# Patient Record
Sex: Male | Born: 1960 | Race: White | Hispanic: No | Marital: Single | Smoking: Former smoker
Health system: Southern US, Community
[De-identification: ages and names within clinical notes are randomized; demographics above are authoritative.]

## PROBLEM LIST (undated history)

## (undated) DIAGNOSIS — E7800 Pure hypercholesterolemia, unspecified: Secondary | ICD-10-CM

## (undated) DIAGNOSIS — T8859XA Other complications of anesthesia, initial encounter: Secondary | ICD-10-CM

## (undated) DIAGNOSIS — R42 Dizziness and giddiness: Principal | ICD-10-CM

## (undated) DIAGNOSIS — E0590 Thyrotoxicosis, unspecified without thyrotoxic crisis or storm: Secondary | ICD-10-CM

## (undated) DIAGNOSIS — R112 Nausea with vomiting, unspecified: Secondary | ICD-10-CM

## (undated) DIAGNOSIS — K635 Polyp of colon: Secondary | ICD-10-CM

## (undated) DIAGNOSIS — I10 Essential (primary) hypertension: Secondary | ICD-10-CM

## (undated) DIAGNOSIS — C801 Malignant (primary) neoplasm, unspecified: Secondary | ICD-10-CM

## (undated) DIAGNOSIS — M1990 Unspecified osteoarthritis, unspecified site: Secondary | ICD-10-CM

## (undated) DIAGNOSIS — K219 Gastro-esophageal reflux disease without esophagitis: Secondary | ICD-10-CM

## (undated) DIAGNOSIS — Z8489 Family history of other specified conditions: Secondary | ICD-10-CM

## (undated) DIAGNOSIS — G40209 Localization-related (focal) (partial) symptomatic epilepsy and epileptic syndromes with complex partial seizures, not intractable, without status epilepticus: Secondary | ICD-10-CM

## (undated) DIAGNOSIS — D1800 Hemangioma unspecified site: Secondary | ICD-10-CM

## (undated) DIAGNOSIS — Z9889 Other specified postprocedural states: Secondary | ICD-10-CM

## (undated) HISTORY — DX: Polyp of colon: K63.5

## (undated) HISTORY — DX: Pure hypercholesterolemia, unspecified: E78.00

## (undated) HISTORY — DX: Localization-related (focal) (partial) symptomatic epilepsy and epileptic syndromes with complex partial seizures, not intractable, without status epilepticus: G40.209

## (undated) HISTORY — PX: MENISCUS REPAIR: SHX5179

## (undated) HISTORY — DX: Essential (primary) hypertension: I10

## (undated) HISTORY — PX: COLONOSCOPY: SHX174

## (undated) HISTORY — DX: Dizziness and giddiness: R42

## (undated) HISTORY — DX: Hemangioma unspecified site: D18.00

## (undated) HISTORY — PX: WISDOM TOOTH EXTRACTION: SHX21

---

## 2006-09-07 ENCOUNTER — Emergency Department (HOSPITAL_COMMUNITY): Admission: EM | Admit: 2006-09-07 | Discharge: 2006-09-07 | Payer: Self-pay | Admitting: Emergency Medicine

## 2006-12-22 ENCOUNTER — Encounter: Payer: Self-pay | Admitting: Internal Medicine

## 2006-12-23 ENCOUNTER — Inpatient Hospital Stay (HOSPITAL_COMMUNITY): Admission: AD | Admit: 2006-12-23 | Discharge: 2006-12-24 | Payer: Self-pay | Admitting: Internal Medicine

## 2007-03-16 ENCOUNTER — Encounter: Admission: RE | Admit: 2007-03-16 | Discharge: 2007-03-16 | Payer: Self-pay | Admitting: Neurology

## 2008-08-01 ENCOUNTER — Encounter: Admission: RE | Admit: 2008-08-01 | Discharge: 2008-08-01 | Payer: Self-pay | Admitting: Neurology

## 2008-08-01 DIAGNOSIS — D1800 Hemangioma unspecified site: Secondary | ICD-10-CM

## 2008-08-01 HISTORY — DX: Hemangioma unspecified site: D18.00

## 2009-07-01 ENCOUNTER — Emergency Department (HOSPITAL_COMMUNITY): Admission: EM | Admit: 2009-07-01 | Discharge: 2009-07-01 | Payer: Self-pay | Admitting: Emergency Medicine

## 2010-05-14 IMAGING — CR DG RIBS 2V*R*
2 series · 2 of 2 positions shown · non-contrast
Comparison: Chest x-ray of 12/22/2006

CLINICAL DATA: Fell in bathtub with pain and right posterior lower
ribs

RIGHT RIBS - 2 VIEW

[w ribs ap/pa upper right *]
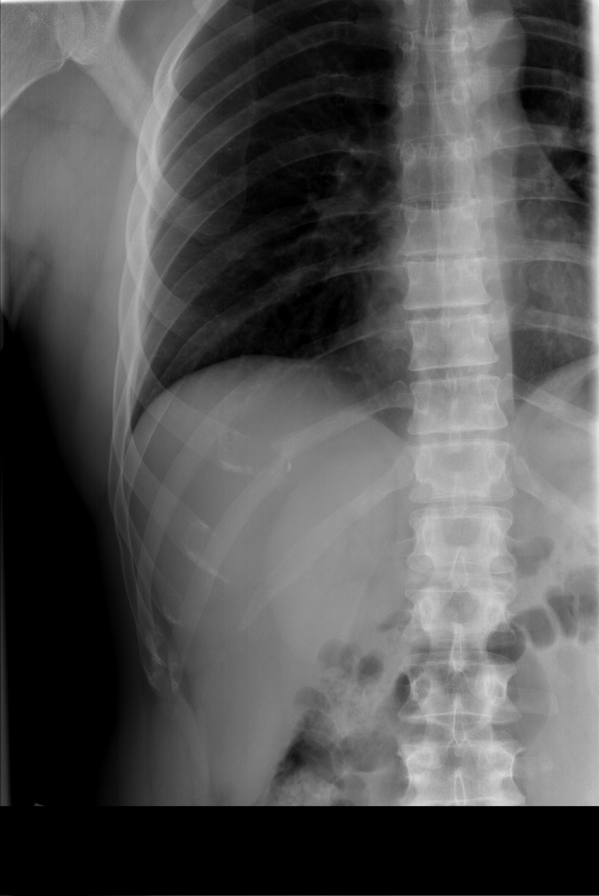

[w ribs ap/pa lower right *]
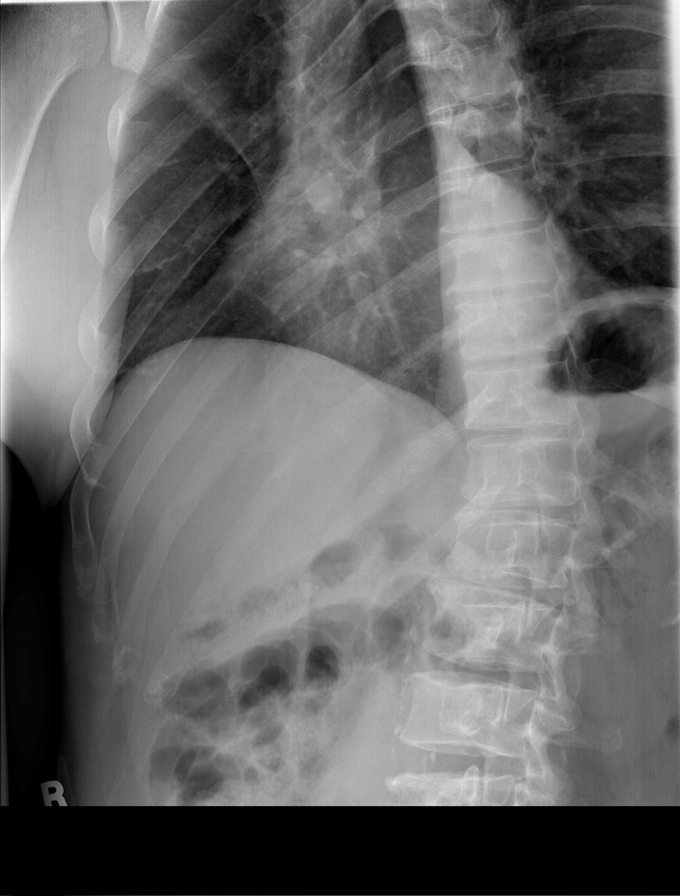

[2 of 2 positions shown; findings below may reference images not displayed]

FINDINGS: There is fracture of the posterior right 11th rib which
may well be acute.  No other acute rib fracture is seen.  The right
lung appears grossly clear.
IMPRESSION: Fracture of the right posterior 11th rib pain which may be acute.

## 2010-06-03 LAB — URINALYSIS, ROUTINE W REFLEX MICROSCOPIC
Bilirubin Urine: NEGATIVE
Glucose, UA: NEGATIVE mg/dL
Hgb urine dipstick: NEGATIVE
Ketones, ur: NEGATIVE mg/dL
Nitrite: NEGATIVE
Protein, ur: NEGATIVE mg/dL
Specific Gravity, Urine: 1.024 (ref 1.005–1.030)
Urobilinogen, UA: 0.2 mg/dL (ref 0.0–1.0)
pH: 6 (ref 5.0–8.0)

## 2010-07-29 NOTE — H&P (Signed)
NAMEMarland Kitchen  BALTAZAR, PEKALA NO.:  [id number]   MEDICAL RECORD NO.:  [id number]          PATIENT TYPE:  OBV   LOCATION:  1321                         FACILITY:  [facility]   PHYSICIAN:  Michelene Gardener, MD    DATE OF BIRTH:  [birth year]   DATE OF ADMISSION:  12/22/2006  DATE OF DISCHARGE:                              HISTORY & PHYSICAL   PRIMARY PHYSICIAN:  [facility].   CHIEF COMPLAINT:  Dizziness, blurred vision and increasing vomiting that  was started today.   HISTORY OF PRESENT ILLNESS:  This is a 50 year old Caucasian male with  no significant past medical history, who presented with the above-  mentioned complaint.  He stated that he was taking his regular diet  every day and he added some nutritional formula because he was working  in his body where he started new vitamins and new meals to help him with  his diet, and that was started yesterday.  Today he slept fine, woke up  around 4:30 to use the bathroom.  Then around 6 o'clock he started  feeling dizzy and then he became cold and sweaty.  He developed  dizziness and unsteady gait and he had blurred vision.  Went to his job  and his symptoms continued and at that time he became more nauseous and  he vomited twice.  Came into the ER, where a CT scan of his head was  done and came to be normal.  When he stood up, he was even more dizzy.  He denied weakness, denied numbness.  There is no tingling.  There is no  incontinence.  He had blurry vision on and off.  There is no slurred  speech, no chest pain and no shortness of breath.Marland Kitchen   PAST MEDICAL HISTORY:  Denied.   PAST SURGICAL HISTORY:  Denied.   ALLERGIES:  No known drug allergies.   MEDICATIONS:  Denied.   SOCIAL HISTORY:  He used to smoke one pack per day for more than 20  years and he quit around 1 year ago.  He drinks 4-5 beers a day and has  been drinking for more than 15 years.  He denied illicit drug use.   FAMILY HISTORY:   Significant for coronary artery disease in his father's  side, and he died at an old age.   REVIEW OF SYSTEMS:  CONSTITUTIONAL:  Positive for fatigability and  dizziness.  EYES:  Positive for blurred vision.  ENT:  He had mild  tinnitus.  There are no hearing abnormalities.  HEART:  No chest pain,  no shortness of breath.  RESPIRATORY:  No cough, no wheezes.  GI:  Positive for nausea, vomiting and mild abdominal discomfort.  There is  no diarrhea and there is no constipation.  GU:  No dysuria and no  hematuria.  ENDOCRINE:  No polyuria, no nocturia.  HEMATOLOGY:  No  bruises, no bleeding.  ID:  No rash, no lesions.  NEUROLOGIC:  No  numbness, no tingling, no weakness, and is positive for dizziness.   PHYSICAL EXAMINATION:  VITAL SIGNS:  Temperature is 96.5, blood pressure  initially was 159/103, pulse 68, respiratory rate is 24.  GENERAL APPEARANCE:  This is a middle-aged Caucasian male not in acute  distress.  HEENT: Conjunctivae are pink.  Pupils are equal, reactive to light and  accommodation.  There is no ptosis.  Hearing is intact.  There is no ear  discharge or infection.  There is no nose infection or bleeding.  Oral  mucosa is dry.  There is no pharyngeal erythema.  NECK:  Supple.  No JVD, no carotid bruit, no thyroid enlargement or  thyroid tenderness.  CARDIOVASCULAR:  S1-S2 are regular.  There are no murmurs or gallops and  no thrills.  RESPIRATORY:  The patient is breathing between 16-18.  There is no use  of accessory muscles.  There are no intercostal retractions.  No  dullness, no other rhonchi and no wheezes.  ABDOMEN:  Soft, not distended, no tenderness or hepatosplenomegaly.  Bowel sounds are normal.  Umbilicus central.  LOWER EXTREMITIES:  No edema, no rash, and no varicose veins.  SKIN:  No rash and no erythema.  NEUROLOGIC:  Cranial nerves are intact I-XII.  There is no motor or  sensory deficit.   LAB RESULTS:  WBC 9.4, hemoglobin 16.0, hematocrit 45.6, MCV  91.8,  platelet count is 387.  Sodium 139, potassium 4.1, chloride 101, bicarb  25, glucose 122, BUN 12, creatinine 1.23, calcium 10.1, AST 37, ALT 24.  Alcohol level less than 5.  Urine toxicology is negative.  CT scan of  the head is negative.  Amylase and lipase within normal.   IMPRESSION AND ASSESSMENT:  1. Dizziness.  Etiology is unclear.  The patient has some neurologic      symptoms including dizziness and blurry vision and he is still      complaining of some blurry vision and gait imbalance.  He had CT      scan of the head that came to be normal.  I will get MRI of his      brain.  2. Nausea and vomiting.  We will put him on antiemetics as needed.  I      will give him IV fluids.  3. Alcohol abuse.  His current level is less than 5.  We will watch      him for any evidence of withdrawal symptoms and if so, then we will      start him on Ativan protocol.  4. Hypertension.  This is diet-controlled.  I will just watch him for      now.   TOTAL ASSESSMENT TIME:  1 hour.      Michelene Gardener, MD  Electronically Signed     NAE/MEDQ  D:  12/22/2006  T:  12/23/2006  Job:  [id number]

## 2010-08-01 NOTE — Discharge Summary (Signed)
NAMEMarland Small  GRADY, MOHABIR NO.:  [id number]   MEDICAL RECORD NO.:  [id number]          PATIENT TYPE:  OBV   LOCATION:  1321                         FACILITY:  [facility]   PHYSICIAN:  Michelene Gardener, MD    DATE OF BIRTH:  10-Mar-1961   DATE OF ADMISSION:  12/22/2006  DATE OF DISCHARGE:  12/23/2006                               DISCHARGE SUMMARY   PRIMARY PHYSICIAN:  The patient used to follow with [facility]. Jonathan Small   DISCHARGE DIAGNOSES:  1. 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  2. Dizziness.  3. Nausea and vomiting.  4. Alcohol abuse.  5. Hypertension   DISCHARGE MEDICATIONS:  None.   CONSULTATIONS:  1. Neurosurgical consult.  2. Neurology consult.   RADIOLOGY STUDIES:  1. Abdominal x-ray showed nonspecific bowel dilatation  2. Chest X-Ray: Showed no acute findings.  3. CT scan of the head on October 8 showed no evidence of acute      abnormality.  4. MRI of brain October 8 showed well-defined mass lesion in the      medial left temporal lobe which most likely represent cyst with      associated hemorrhage.  5. MRA of the brain on October 8 showed area of hemorrhage in the left      temporal lobe.  6. Carotid angiogram on October 10  showed no evidence of intracranial      aneurysm or mass and showed mild narrowing at the origin of the      right vertebral artery.  7. Angiogram of the vertebral arteries again showed no evidence of      aneurysm or mass.   COURSE OF HOSPITALIZATION:  This is a 50 year old male with no  significant past medical history, presented to the hospital on October 8  complaining of dizziness, blurred vision and increasing vomiting that  has been on for 1 day.  In the ER he had CT scan of his head that came  to be normal.  He had MRI of his brain showed possible mass with  possible hemorrhage surrounding the mass.  This case was discussed with  neurosurgery who recommended to transfer the patient from  [facility] to [facility] to get an angiogram for further  evaluation.  Angiogram was done at [facility] and showed no evidence of  mass and there is no evidence of hemorrhage and no evidence of aneurysm.  Neurosurgery cleared the patient to go home.  I recommended him to  follow with his primary physician.  Neurosurgery also recommended the  patient to be seen by neurology.  Neurological consultation was called  and they felt that the patient is stable enough and they will only  follow the patient as an outpatient.  Appointment was given with neurology and the patient was recommended to  follow with his primary physician.  No medications are required at this  time and no more intervention or diagnostic studies are recommended at  this point.   Assessment time is 40 minutes.      Nadear A  Arthor Captain, MD  Electronically Signed     NAE/MEDQ  D:  01/03/2007  T:  01/03/2007  Job:  [id number]

## 2010-12-25 LAB — COMPREHENSIVE METABOLIC PANEL
ALT: 24
AST: 37
Albumin: 4.7
Alkaline Phosphatase: 75
BUN: 12
CO2: 25
Calcium: 10.1
Chloride: 101
Creatinine, Ser: 1.28
GFR calc Af Amer: >60
GFR calc non Af Amer: >60
Glucose, Bld: 122 — ABNORMAL HIGH
Potassium: 4.1
Sodium: 139
Total Bilirubin: 1.2
Total Protein: 7.6

## 2010-12-25 LAB — URINALYSIS, ROUTINE W REFLEX MICROSCOPIC
Bilirubin Urine: NEGATIVE
Glucose, UA: NEGATIVE
Hgb urine dipstick: NEGATIVE
Ketones, ur: 15 — AB
Nitrite: NEGATIVE
Protein, ur: NEGATIVE
Specific Gravity, Urine: 1.023
Urobilinogen, UA: 0.2
pH: 7

## 2010-12-25 LAB — CBC
HCT: 45.6
Hemoglobin: 16
MCHC: 35
MCV: 91.8
Platelets: 387
RBC: 4.97
RDW: 12.5
WBC: 9.4

## 2010-12-25 LAB — DIFFERENTIAL
Basophils Absolute: 0
Basophils Relative: 0
Eosinophils Absolute: 0
Eosinophils Relative: 0
Lymphocytes Relative: 11 — ABNORMAL LOW
Lymphs Abs: 1
Monocytes Absolute: 0.6
Monocytes Relative: 7
Neutro Abs: 7.8 — ABNORMAL HIGH
Neutrophils Relative %: 83 — ABNORMAL HIGH

## 2010-12-25 LAB — RAPID URINE DRUG SCREEN, HOSP PERFORMED
Amphetamines: NOT DETECTED
Barbiturates: NOT DETECTED
Benzodiazepines: NOT DETECTED
Cocaine: NOT DETECTED
Opiates: NOT DETECTED
Tetrahydrocannabinol: NOT DETECTED

## 2010-12-25 LAB — APTT
aPTT: 27
aPTT: 29

## 2010-12-25 LAB — PROTIME-INR
INR: 1
INR: 1
Prothrombin Time: 13.2
Prothrombin Time: 13.8

## 2010-12-25 LAB — LIPASE, BLOOD: Lipase: 32

## 2010-12-25 LAB — TRICYCLICS SCREEN, URINE: TCA Scrn: NOT DETECTED

## 2010-12-25 LAB — ETHANOL: Alcohol, Ethyl (B): <5

## 2010-12-25 LAB — AMYLASE: Amylase: 72

## 2010-12-31 LAB — POCT CARDIAC MARKERS
CKMB, poc: 3.3
Myoglobin, poc: 61
Operator id: 4661
Troponin i, poc: <0.05

## 2010-12-31 LAB — BASIC METABOLIC PANEL
BUN: 14
CO2: 29
Calcium: 9.8
Chloride: 102
Creatinine, Ser: 1.28
GFR calc Af Amer: >60
GFR calc non Af Amer: >60
Glucose, Bld: 115 — ABNORMAL HIGH
Potassium: 4.4
Sodium: 139

## 2010-12-31 LAB — CBC
HCT: 43.6
Hemoglobin: 15.1
MCHC: 34.6
MCV: 91.7
Platelets: 392
RBC: 4.76
RDW: 12.9
WBC: 7.7

## 2010-12-31 LAB — DIFFERENTIAL
Basophils Absolute: 0
Basophils Relative: 0
Eosinophils Absolute: 0.1
Eosinophils Relative: 2
Lymphocytes Relative: 20
Lymphs Abs: 1.5
Monocytes Absolute: 0.7
Monocytes Relative: 9
Neutro Abs: 5.3
Neutrophils Relative %: 69

## 2013-06-07 ENCOUNTER — Encounter: Payer: Self-pay | Admitting: Neurology

## 2013-06-08 ENCOUNTER — Encounter (INDEPENDENT_AMBULATORY_CARE_PROVIDER_SITE_OTHER): Payer: Self-pay

## 2013-06-08 ENCOUNTER — Encounter: Payer: Self-pay | Admitting: Neurology

## 2013-06-08 ENCOUNTER — Ambulatory Visit (INDEPENDENT_AMBULATORY_CARE_PROVIDER_SITE_OTHER): Payer: Managed Care, Other (non HMO) | Admitting: Neurology

## 2013-06-08 VITALS — BP 141/89 | HR 77 | Ht 69.0 in | Wt 180.0 lb

## 2013-06-08 DIAGNOSIS — G40209 Localization-related (focal) (partial) symptomatic epilepsy and epileptic syndromes with complex partial seizures, not intractable, without status epilepticus: Secondary | ICD-10-CM

## 2013-06-08 DIAGNOSIS — R42 Dizziness and giddiness: Secondary | ICD-10-CM

## 2013-06-08 HISTORY — DX: Localization-related (focal) (partial) symptomatic epilepsy and epileptic syndromes with complex partial seizures, not intractable, without status epilepticus: G40.209

## 2013-06-08 HISTORY — DX: Dizziness and giddiness: R42

## 2013-06-08 NOTE — Progress Notes (Signed)
Reason for visit: Dizziness  Jonathan Small is a 53 y.o. male  History of present illness:  Jonathan Small is a 53 year old right-handed white male with a history of episodes of dizziness that are preceded by an unusual odor. The patient was seen through this office in 2010 by Dr. Doy Mince. 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 The patient was having seizure-type events only on occasion. The patient indicates that he has gone almost 2 years without any events, and he has had several episodes over the last 3 months. The patient indicates that he is working more than usual, and he is getting fatigued. The patient has not had any alteration in consciousness with the seizure events, and he denies any problems with jerking. The patient will have to stop what he is doing, and sit still for several minutes until the episode passes. The patient denies any headache before, during, or after the episode. The patient denies any falls with the dizzy episodes. The patient has no numbness or weakness on the extremities or problems with slurred speech. The patient is sent to this office for an evaluation.  Past Medical History  Diagnosis Date  . Hypertension   . Hypercholesteremia   . 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 Polyp of colon   . Dizziness and giddiness 06/08/2013  . Localization-related (focal) (partial) epilepsy and epileptic syndromes with complex partial seizures, without mention of intractable epilepsy 06/08/2013    Past Surgical History  Procedure Laterality Date  . Colonoscopy      Family History  Problem Relation Age of Onset  . Hypercholesterolemia Mother   . Hypertension Mother   . Hypertension Father   . Hypercholesterolemia Father   . Stroke Father   . Diabetes Father   . Cancer Maternal Grandmother   . Cancer Maternal Grandfather   . Seizures Neg Hx     Social  history:  reports that he has quit smoking. He has never used smokeless tobacco. He reports that he drinks alcohol. He reports that he does not use illicit drugs.  Medications:  Current Outpatient Prescriptions on File Prior to Visit  Medication Sig Dispense Refill  . aspirin 81 MG tablet Take 81 mg by mouth daily.      . irbesartan-hydrochlorothiazide (AVALIDE) 300-12.5 MG per tablet Take 1 tablet by mouth daily.      . sildenafil (VIAGRA) 100 MG tablet Take 100 mg by mouth daily as needed for erectile dysfunction.      . simvastatin (ZOCOR) 40 MG tablet Take 40 mg by mouth daily.      . valACYclovir (VALTREX) 1000 MG tablet Take 1,000 mg by mouth 2 (two) times daily as needed.       No current facility-administered medications on file prior to visit.      Allergies  Allergen Reactions  . Benicar [Olmesartan] Rash    ROS:  Out of a complete 14 system review of symptoms, the patient complains only of the following symptoms, and all other reviewed systems are negative.  Dizziness  Blood pressure 141/89, pulse 77, height 5\' 9"  (1.753 m), weight 180 lb (81.647 kg).  Physical Exam  General: The patient is alert and cooperative at the time of the examination.  Eyes: Pupils are equal, round, and reactive to light. Discs are flat bilaterally.  Neck: The neck is supple, no  carotid bruits are noted.  Respiratory: The respiratory examination is clear.  Cardiovascular: The cardiovascular examination reveals a regular rate and rhythm, no obvious murmurs or rubs are noted.  Skin: Extremities are without significant edema.  Neurologic Exam  Mental status: The patient is alert and oriented x 3 at the time of the examination. The patient has apparent normal recent and remote memory, with an apparently normal attention span and concentration ability.  Cranial nerves: Facial symmetry is present. There is good sensation of the face to pinprick and soft touch bilaterally. The strength of  the facial muscles and the muscles to head turning and shoulder shrug are normal bilaterally. Speech is well enunciated, no aphasia or dysarthria is noted. Extraocular movements are full. Visual fields are full. The tongue is midline, and the patient has symmetric elevation of the soft palate. No obvious hearing deficits are noted.  Motor: The motor testing reveals 5 over 5 strength of all 4 extremities. Good symmetric motor tone is noted throughout.  Sensory: Sensory testing is intact to pinprick, soft touch, vibration sensation, and position sense on all 4 extremities. No evidence of extinction is noted.  Coordination: Cerebellar testing reveals good finger-nose-finger and heel-to-shin bilaterally.  Gait and station: Gait is normal. Tandem gait is normal. Romberg is negative. No drift is seen.  Reflexes: Deep tendon reflexes are symmetric and normal bilaterally. Toes are downgoing bilaterally.   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 There has been  involution since the prior studies of 2008.    Assessment/Plan:  1. Simple partial seizures  2. Left mesial temporal cavernous angioma  The patient likely is having episodes of seizures, but the seizures do not generalize, and the patient does not have alteration of consciousness. So far, the seizures have not been extremely frequent, but if this changes over time, the patient may require anticonvulsant medications. The patient will be set up for MRI evaluation of the brain to reevaluate the left temporal area to exclude a microhemorrhage. The patient will followup through this office if needed. The patient is to contact me if the seizure frequency dramatically increases.  Jill Alexanders MD 06/08/2013  7:39 PM  [facility] [address] [place], [place] [address]  Phone [phone] Fax [phone]

## 2013-06-08 NOTE — Patient Instructions (Signed)
Epilepsy Epilepsy is a disorder in which a person has repeated seizures over time. A seizure is a release of abnormal electrical activity in the brain. Seizures can cause a change in attention, behavior, or the ability to remain awake and alert (altered mental status). Seizures often involve uncontrollable shaking (convulsions).  Most people with epilepsy lead normal lives. However, people with epilepsy are at an increased risk of falls, accidents, and injuries. Therefore, it is important to begin treatment right away. CAUSES  Epilepsy has many possible causes. Anything that disturbs the normal pattern of brain cell activity can lead to seizures. This may include:   Head injury.  Birth trauma.  High fever as a child.  Stroke.  Bleeding into or around the brain.  Certain drugs.  Prolonged low oxygen, such as what occurs after CPR efforts.  Abnormal brain development.  Certain illnesses, such as meningitis, encephalitis (brain infection), malaria, and other infections.  An imbalance of nerve signaling chemicals (neurotransmitters).  SIGNS AND SYMPTOMS  The symptoms of a seizure can vary greatly from one person to another. Right before a seizure, you may have a warning (aura) that a seizure is about to occur. An aura may include the following symptoms:  Fear or anxiety.  Nausea.  Feeling like the room is spinning (vertigo).  Vision changes, such as seeing flashing lights or spots. Common symptoms during a seizure include:  Abnormal sensations, such as an abnormal smell or a bitter taste in the mouth.   Sudden, general body stiffness.   Convulsions that involve rhythmic jerking of the face, arm, or leg on one or both sides.   Sudden change in consciousness.   Appearing to be awake but not responding.   Appearing to be asleep but cannot be awakened.   Grimacing, chewing, lip smacking, drooling, tongue biting, or loss of bowel or bladder control. After a seizure,  you may feel sleepy for a while. DIAGNOSIS  Your health care provider will ask about your symptoms and take a medical history. Descriptions from any witnesses to your seizures will be very helpful in the diagnosis. A physical exam, including a detailed neurological exam, is necessary. Various tests may be done, such as:   An electroencephalogram (EEG). This is a painless test of your brain waves. In this test, a diagram is created of your brain waves. These diagrams can be interpreted by a specialist.  An MRI of the brain.   A CT scan of the brain.   A spinal tap (lumbar puncture, LP).  Blood tests to check for signs of infection or abnormal blood chemistry. TREATMENT  There is no cure for epilepsy, but it is generally treatable. Once epilepsy is diagnosed, it is important to begin treatment as soon as possible. For most people with epilepsy, seizures can be controlled with medicines. The following may also be used:  A pacemaker for the brain (vagus nerve stimulator) can be used for people with seizures that are not well controlled by medicine.  Surgery on the brain. For some people, epilepsy eventually goes away. HOME CARE INSTRUCTIONS   Follow your health care provider's recommendations on driving and safety in normal activities.  Get enough rest. Lack of sleep can cause seizures.  Only take over-the-counter or prescription medicines as directed by your health care provider. Take any prescribed medicine exactly as directed.  Avoid any known triggers of your seizures.  Keep a seizure diary. Record what you recall about any seizure, especially any possible trigger.   Make   sure the people you live and work with know that you are prone to seizures. They should receive instructions on how to help you. In general, a witness to a seizure should:   Cushion your head and body.   Turn you on your side.   Avoid unnecessarily restraining you.   Not place anything inside your  mouth.   Call for emergency medical help if there is any question about what has occurred.   Follow up with your health care provider as directed. You may need regular blood tests to monitor the levels of your medicine.  SEEK MEDICAL CARE IF:   You develop signs of infection or other illness. This might increase the risk of a seizure.   You seem to be having more frequent seizures.   Your seizure pattern is changing.  SEEK IMMEDIATE MEDICAL CARE IF:   You have a seizure that does not stop after a few moments.   You have a seizure that causes any difficulty in breathing.   You have a seizure that results in a very severe headache.   You have a seizure that leaves you with the inability to speak or use a part of your body.  Document Released: 03/02/2005 Document Revised: 12/21/2012 Document Reviewed: 10/12/2012 [employer] Patient Information 2014 [employer].  

## 2017-12-10 DIAGNOSIS — Z23 Encounter for immunization: Secondary | ICD-10-CM | POA: Diagnosis not present

## 2017-12-10 DIAGNOSIS — E782 Mixed hyperlipidemia: Secondary | ICD-10-CM | POA: Diagnosis not present

## 2017-12-10 DIAGNOSIS — I10 Essential (primary) hypertension: Secondary | ICD-10-CM | POA: Diagnosis not present

## 2017-12-10 DIAGNOSIS — Z566 Other physical and mental strain related to work: Secondary | ICD-10-CM | POA: Diagnosis not present

## 2018-04-07 DIAGNOSIS — E782 Mixed hyperlipidemia: Secondary | ICD-10-CM | POA: Diagnosis not present

## 2018-04-07 DIAGNOSIS — E0590 Thyrotoxicosis, unspecified without thyrotoxic crisis or storm: Secondary | ICD-10-CM | POA: Diagnosis not present

## 2018-04-07 DIAGNOSIS — I10 Essential (primary) hypertension: Secondary | ICD-10-CM | POA: Diagnosis not present

## 2018-04-07 DIAGNOSIS — Z125 Encounter for screening for malignant neoplasm of prostate: Secondary | ICD-10-CM | POA: Diagnosis not present

## 2018-06-10 DIAGNOSIS — E0590 Thyrotoxicosis, unspecified without thyrotoxic crisis or storm: Secondary | ICD-10-CM | POA: Diagnosis not present

## 2018-06-10 DIAGNOSIS — H5789 Other specified disorders of eye and adnexa: Secondary | ICD-10-CM | POA: Diagnosis not present

## 2018-07-21 DIAGNOSIS — M25561 Pain in right knee: Secondary | ICD-10-CM | POA: Diagnosis not present

## 2018-09-01 DIAGNOSIS — S60221A Contusion of right hand, initial encounter: Secondary | ICD-10-CM | POA: Diagnosis not present

## 2018-09-01 DIAGNOSIS — S83241D Other tear of medial meniscus, current injury, right knee, subsequent encounter: Secondary | ICD-10-CM | POA: Diagnosis not present

## 2018-09-07 DIAGNOSIS — E0590 Thyrotoxicosis, unspecified without thyrotoxic crisis or storm: Secondary | ICD-10-CM | POA: Diagnosis not present

## 2019-02-13 ENCOUNTER — Other Ambulatory Visit: Payer: Self-pay | Admitting: Cardiology

## 2019-02-13 DIAGNOSIS — Z20822 Contact with and (suspected) exposure to covid-19: Secondary | ICD-10-CM

## 2019-02-15 LAB — NOVEL CORONAVIRUS, NAA: SARS-CoV-2, NAA: NOT DETECTED

## 2019-03-22 ENCOUNTER — Other Ambulatory Visit: Payer: Self-pay | Admitting: Cardiology

## 2019-03-22 DIAGNOSIS — Z20822 Contact with and (suspected) exposure to covid-19: Secondary | ICD-10-CM

## 2019-03-24 LAB — NOVEL CORONAVIRUS, NAA

## 2019-07-04 DIAGNOSIS — R102 Pelvic and perineal pain: Secondary | ICD-10-CM | POA: Diagnosis not present

## 2019-07-04 DIAGNOSIS — R351 Nocturia: Secondary | ICD-10-CM | POA: Diagnosis not present

## 2019-07-20 DIAGNOSIS — S83241D Other tear of medial meniscus, current injury, right knee, subsequent encounter: Secondary | ICD-10-CM | POA: Diagnosis not present

## 2019-07-24 NOTE — Progress Notes (Signed)
07/25/19 3:58 PM   Kerrie Buffalo 1960-05-06 [id number]  Referring provider: Lawerance Cruel, MD [address] [place],  [place] [address] Chief Complaint  Patient presents with  . Nocturia    HPI: Jonathan Small is a 59 y.o. M who presents today for the evaluation and management of nocturia.   Visited PCP on 07/04/19 c/o of nocturia 1-2 to 15x a night and waking up w/ urgency.   Today, he reports of nocturia x 5-6 managed w/ Tamsulosin 0.4 mg started 1 month ago.   1 year ago he was getting up x 2-3.   He eats supper close to his bedtime w/ 3-4 12 oz beers q day 2 hours prior to bed. He also states of drinking 1 gallon of water throughout the day.   He does have leg swelling which he manages w/ ankle braces.   PVR 14 mL.   He his a loud snorer at night and has not undergone a sleep study before.   Denies hx of diabetes and has not seen a urologist before. Denies hx of kidney stones, UTI, dysuria or gross hematuria.   FHx of prostate issues.   PSA 1.32 as of 04/17/19.   IPSS    Row Name 07/25/19 1300         International Prostate Symptom Score   How often have you had the sensation of not emptying your bladder?  About half the time     How often have you had to urinate less than every two hours?  Less than half the time     How often have you found you stopped and started again several times when you urinated?  More than half the time     How often have you found it difficult to postpone urination?  Not at All     How often have you had a weak urinary stream?  More than half the time     How often have you had to strain to start urination?  More than half the time     How many times did you typically get up at night to urinate?  5 Times     Total IPSS Score  22       Quality of Life due to urinary symptoms   If you were to spend the rest of your life with your urinary condition just the way it is now how would you feel about that?  Mostly Disatisfied         Score:  1-7 Mild 8-19 Moderate 20-35 Severe   PMH: Past Medical History:  Diagnosis Date  . 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 Dizziness and giddiness 06/08/2013  . Hypercholesteremia   . Hypertension   . Localization-related (focal) (partial) epilepsy and epileptic syndromes with complex partial seizures, without mention of intractable epilepsy 06/08/2013  . Polyp of colon     Surgical History: Past Surgical History:  Procedure Laterality Date  . COLONOSCOPY      Home Medications:  Allergies as of 07/25/2019      Reactions   Benicar [olmesartan] Rash      Medication List       Accurate as of Jul 25, 2019  3:58 PM. If you have any questions, ask your nurse or doctor.        amitriptyline 10 MG tablet Commonly known as: ELAVIL Take 10 mg by mouth daily.   aspirin 81 MG tablet Take 81 mg by  mouth daily.   irbesartan-hydrochlorothiazide 300-12.5 MG tablet Commonly known as: AVALIDE Take 1 tablet by mouth daily.   methimazole 5 MG tablet Commonly known as: TAPAZOLE   sildenafil 100 MG tablet Commonly known as: VIAGRA Take 100 mg by mouth daily as needed for erectile dysfunction.   simvastatin 40 MG tablet Commonly known as: ZOCOR Take 40 mg by mouth daily.   valACYclovir 1000 MG tablet Commonly known as: VALTREX Take 1,000 mg by mouth 2 (two) times daily as needed.       Allergies:  Allergies  Allergen Reactions  . Benicar [Olmesartan] Rash    Family History: Family History  Problem Relation Age of Onset  . Hypercholesterolemia Mother   . Hypertension Mother   . Hypertension Father   . Hypercholesterolemia Father   . Stroke Father   . Diabetes Father   . Cancer Maternal Grandmother   . Cancer Maternal Grandfather   . Seizures Neg Hx     Social History:  reports that he has quit smoking. He has never used smokeless tobacco. He reports current alcohol use. He reports that he does not use drugs.   Physical Exam: BP (!)  163/93   Pulse 80   Ht 5\' 8"  (1.727 m)   Wt 176 lb (79.8 kg)   BMI 26.76 kg/m   Constitutional:  Alert and oriented, No acute distress. HEENT: [place] AT, moist mucus membranes.  Trachea midline, no masses. Thick neck. Cardiovascular: No clubbing, cyanosis, or edema. Respiratory: Normal respiratory effort, no increased work of breathing. Rectal: 50+ g prostate w/ no nodules or tenderness, normal sphincter tone Skin: No rashes, bruises or suspicious lesions. Neurologic: Grossly intact, no focal deficits, moving all 4 extremities. Psychiatric: Normal mood and affect.  Laboratory Data:  Urinalysis UA negative.   Pertinent Imaging: Results for orders placed or performed in visit on 07/25/19  BLADDER SCAN AMB NON-IMAGING  Result Value Ref Range   Scan Result 14 ML    Assessment & Plan:    1. Nocturia  Adequate emptying Symptoms primary during night thus suspect multifactoria Discussed behavior modifications including not drinking 4 hrs prior to bedtime and limited beer intake - suspect large contributing factor Recommended him to discuss sleep study w/ PCP for referral- pathophysiology of this discussed in detail and history highly suspicious for undiagnosed/ untreated OSA Will consider further intervention if above is ineffective  F/u in 3 months to reaccess   2. BPH with lower urinary tract symptoms Prostate is mildly enlarged today but primary bother #1 Recommend continue flomax and address #1 first If symptoms fail to improve, consider addition of finasteride vs. Surgical/ procedural intervention He understands the above is agreeable with this plan  F/u 3 months for IPSS/ [facility]  [facility] [address] [place],  [address] [phone]  I, Lucas Mallow, am acting as a scribe for Dr. Hollice Espy,  I have reviewed the above documentation for accuracy and completeness, and I agree with the above.   Hollice Espy,  MD

## 2019-07-25 ENCOUNTER — Encounter: Payer: Self-pay | Admitting: Urology

## 2019-07-25 ENCOUNTER — Other Ambulatory Visit: Payer: Self-pay

## 2019-07-25 ENCOUNTER — Ambulatory Visit: Payer: BC Managed Care – PPO | Admitting: Urology

## 2019-07-25 VITALS — BP 163/93 | HR 80 | Ht 68.0 in | Wt 176.0 lb

## 2019-07-25 DIAGNOSIS — R351 Nocturia: Secondary | ICD-10-CM

## 2019-07-25 LAB — BLADDER SCAN AMB NON-IMAGING: Scan Result: 14

## 2019-07-26 LAB — URINALYSIS, COMPLETE
Bilirubin, UA: NEGATIVE
Glucose, UA: NEGATIVE
Ketones, UA: NEGATIVE
Leukocytes,UA: NEGATIVE
Nitrite, UA: NEGATIVE
Protein,UA: NEGATIVE
RBC, UA: NEGATIVE
Specific Gravity, UA: 1.02 (ref 1.005–1.030)
Urobilinogen, Ur: 0.2 mg/dL (ref 0.2–1.0)
pH, UA: 7 (ref 5.0–7.5)

## 2019-07-26 LAB — MICROSCOPIC EXAMINATION
Bacteria, UA: NONE SEEN
Epithelial Cells (non renal): NONE SEEN /hpf (ref 0–10)
RBC, Urine: NONE SEEN /hpf (ref 0–2)

## 2019-08-02 ENCOUNTER — Telehealth: Payer: Self-pay | Admitting: *Deleted

## 2019-08-02 ENCOUNTER — Encounter: Payer: Self-pay | Admitting: *Deleted

## 2019-08-02 NOTE — Telephone Encounter (Addendum)
Mailed letter to PCP  ----- Message from Hollice Espy, MD sent at 07/25/2019  4:02 PM EDT ----- This patient's PCP is not in our system.  I want to make sure he see's my recommendations of sleep study referral.

## 2019-08-08 DIAGNOSIS — H11002 Unspecified pterygium of left eye: Secondary | ICD-10-CM | POA: Diagnosis not present

## 2019-08-30 DIAGNOSIS — H2513 Age-related nuclear cataract, bilateral: Secondary | ICD-10-CM | POA: Diagnosis not present

## 2019-08-30 DIAGNOSIS — H02883 Meibomian gland dysfunction of right eye, unspecified eyelid: Secondary | ICD-10-CM | POA: Diagnosis not present

## 2019-08-30 DIAGNOSIS — H02886 Meibomian gland dysfunction of left eye, unspecified eyelid: Secondary | ICD-10-CM | POA: Diagnosis not present

## 2019-10-05 DIAGNOSIS — H11052 Peripheral pterygium, progressive, left eye: Secondary | ICD-10-CM | POA: Diagnosis not present

## 2019-10-30 NOTE — Progress Notes (Signed)
10/31/2019 12:49 PM   Jonathan Small [birth year] [id number]  Referring provider: Lawerance Cruel, MD [address] [place],  [place] [address] Chief Complaint  Patient presents with  . Nocturia    HPI: Jonathan Small is a 59 y.o. male who presents today for a 3 month follow up of BPH with LUTS and nocturia.   Visited PCP on 07/04/19 c/o of nocturia 1-2 to 15x a night and waking up w/ urgency.   Last visit, he reported of nocturia x 5-6 managed w/ Tamsulosin 0.4 mg started 1 month ago. 1 year ago he was getting up x 2-3. He ate supper close to his bedtime w/ 3-4 12 oz beers q day 2 hours prior to bed. He also stateed of drinking 1 gallon of water throughout the day. He does have leg swelling which he manages w/ ankle braces. PVR was 14 mL.   He was a loud snorer at night and had not undergone a sleep study before.   Denies hx of diabetes and has not seen a urologist before. Denies hx of kidney stones, UTI, dysuria or gross hematuria.   FHx of prostate issues.   PSA 1.32 as of 04/17/19.   Today he reports having to force his urination. When he has flatulence he no longer need to urinate. He feels like symptoms are more during the day time now.   He is no longer taking Flomax secondary to having no symptomatic relief.   He reports being under a lot of work and family related stress. He has not undergone a sleep study.   IPSS    Row Name 10/31/19 0800         International Prostate Symptom Score   How often have you had the sensation of not emptying your bladder? About half the time     How often have you had to urinate less than every two hours? More than half the time     How often have you found you stopped and started again several times when you urinated? More than half the time     How often have you found it difficult to postpone urination? Not at All     How often have you had a weak urinary stream? About half the time     How often have you had to strain to  start urination? More than half the time     How many times did you typically get up at night to urinate? 5 Times     Total IPSS Score 23       Quality of Life due to urinary symptoms   If you were to spend the rest of your life with your urinary condition just the way it is now how would you feel about that? Mostly Disatisfied            Score:  1-7 Mild 8-19 Moderate 20-35 Severe  PMH: Past Medical History:  Diagnosis Date  . 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 Dizziness and giddiness 06/08/2013  . Hypercholesteremia   . Hypertension   . Localization-related (focal) (partial) epilepsy and epileptic syndromes with complex partial seizures, without mention of intractable epilepsy 06/08/2013  . Polyp of colon     Surgical History: Past Surgical History:  Procedure Laterality Date  . COLONOSCOPY      Home Medications:  Allergies as of 10/31/2019      Reactions   Benicar [olmesartan] Rash  Medication List       Accurate as of October 31, 2019 12:49 PM. If you have any questions, ask your nurse or doctor.        amitriptyline 10 MG tablet Commonly known as: ELAVIL Take 10 mg by mouth daily.   aspirin 81 MG tablet Take 81 mg by mouth daily.   irbesartan-hydrochlorothiazide 300-12.5 MG tablet Commonly known as: AVALIDE Take 1 tablet by mouth daily.   methazolamide 50 MG tablet Commonly known as: NEPTAZANE Take by mouth.   methimazole 5 MG tablet Commonly known as: TAPAZOLE   sildenafil 100 MG tablet Commonly known as: VIAGRA Take 100 mg by mouth daily as needed for erectile dysfunction.   simvastatin 40 MG tablet Commonly known as: ZOCOR Take 40 mg by mouth daily.   simvastatin 10 MG tablet Commonly known as: ZOCOR Take by mouth.   telmisartan 20 MG tablet Commonly known as: MICARDIS Take by mouth.   telmisartan-hydrochlorothiazide 80-12.5 MG tablet Commonly known as: MICARDIS HCT   valACYclovir 1000 MG tablet Commonly known as:  VALTREX Take 1,000 mg by mouth 2 (two) times daily as needed.       Allergies:  Allergies  Allergen Reactions  . Benicar [Olmesartan] Rash    Family History: Family History  Problem Relation Age of Onset  . Hypercholesterolemia Mother   . Hypertension Mother   . Hypertension Father   . Hypercholesterolemia Father   . Stroke Father   . Diabetes Father   . Cancer Maternal Grandmother   . Cancer Maternal Grandfather   . Seizures Neg Hx     Social History:  reports that he has quit smoking. He has never used smokeless tobacco. He reports current alcohol use. He reports that he does not use drugs.   Physical Exam: BP (!) 168/99   Pulse 77   Wt 175 lb (79.4 kg)   BMI 26.61 kg/m   Constitutional:  Alert and oriented, No acute distress. HEENT: [place] AT, moist mucus membranes.  Trachea midline, no masses. Cardiovascular: No clubbing, cyanosis, or edema. Respiratory: Normal respiratory effort, no increased work of breathing. Skin: No rashes, bruises or suspicious lesions. Neurologic: Grossly intact, no focal deficits, moving all 4 extremities. Psychiatric: Normal mood and affect.   Assessment & Plan:    1.Nocturia Encouraged patient to discuss sleep study with PCP. Patient adamantly reports that he does not snore however this still could be an underlying factor and often is when symptoms are primarily nocturnal  2. BPH with LUTS  IPSS score: 23, severe. Patient stopped Flomax due to no symptomatic relief.  We discussed addition of finasteride vs surgical/procedural intervention.  Patient agreed to cystoscopy/ TRUS for surgical planning  Schedule cysto / [facility] [address], [place], [place] [address] [phone]  I, Selena Batten, am acting as a scribe for Dr. Hollice Espy.  I have reviewed the above documentation for accuracy and completeness, and I agree with the above.   Hollice Espy, MD

## 2019-10-31 ENCOUNTER — Other Ambulatory Visit: Payer: Self-pay

## 2019-10-31 ENCOUNTER — Ambulatory Visit: Payer: BC Managed Care – PPO | Admitting: Urology

## 2019-10-31 VITALS — BP 168/99 | HR 77 | Wt 175.0 lb

## 2019-10-31 DIAGNOSIS — R351 Nocturia: Secondary | ICD-10-CM | POA: Diagnosis not present

## 2019-10-31 DIAGNOSIS — R3911 Hesitancy of micturition: Secondary | ICD-10-CM

## 2019-10-31 DIAGNOSIS — N401 Enlarged prostate with lower urinary tract symptoms: Secondary | ICD-10-CM | POA: Diagnosis not present

## 2019-10-31 NOTE — Patient Instructions (Signed)
Cystoscopy Cystoscopy is a procedure that is used to help diagnose and sometimes treat conditions that affect the lower urinary tract. The lower urinary tract includes the bladder and the urethra. The urethra is the tube that drains urine from the bladder. Cystoscopy is done using a thin, tube-shaped instrument with a light and camera at the end (cystoscope). The cystoscope may be hard or flexible, depending on the goal of the procedure. The cystoscope is inserted through the urethra, into the bladder. Cystoscopy may be recommended if you have:  Urinary tract infections that keep coming back.  Blood in the urine (hematuria).  An inability to control when you urinate (urinary incontinence) or an overactive bladder.  Unusual cells found in a urine sample.  A blockage in the urethra, such as a urinary stone.  Painful urination.  An abnormality in the bladder found during an intravenous pyelogram (IVP) or CT scan. Cystoscopy may also be done to remove a sample of tissue to be examined under a microscope (biopsy). What are the risks? Generally, this is a safe procedure. However, problems may occur, including:  Infection.  Bleeding.  What happens during the procedure?  1. You will be given one or more of the following: ? A medicine to numb the area (local anesthetic). 2. The area around the opening of your urethra will be cleaned. 3. The cystoscope will be passed through your urethra into your bladder. 4. Germ-free (sterile) fluid will flow through the cystoscope to fill your bladder. The fluid will stretch your bladder so that your health care provider can clearly examine your bladder walls. 5. Your doctor will look at the urethra and bladder. 6. The cystoscope will be removed The procedure may vary among health care providers  What can I expect after the procedure? After the procedure, it is common to have: 1. Some soreness or pain in your abdomen and urethra. 2. Urinary symptoms.  These include: ? Mild pain or burning when you urinate. Pain should stop within a few minutes after you urinate. This may last for up to 1 week. ? A small amount of blood in your urine for several days. ? Feeling like you need to urinate but producing only a small amount of urine. Follow these instructions at home: General instructions  Return to your normal activities as told by your health care provider.   Do not drive for 24 hours if you were given a sedative during your procedure.  Watch for any blood in your urine. If the amount of blood in your urine increases, call your health care provider.  If a tissue sample was removed for testing (biopsy) during your procedure, it is up to you to get your test results. Ask your health care provider, or the department that is doing the test, when your results will be ready.  Drink enough fluid to keep your urine pale yellow.  Keep all follow-up visits as told by your health care provider. This is important. Contact a health care provider if you:  Have pain that gets worse or does not get better with medicine, especially pain when you urinate.  Have trouble urinating.  Have more blood in your urine. Get help right away if you:  Have blood clots in your urine.  Have abdominal pain.  Have a fever or chills.  Are unable to urinate. Summary  Cystoscopy is a procedure that is used to help diagnose and sometimes treat conditions that affect the lower urinary tract.  Cystoscopy is done using   a thin, tube-shaped instrument with a light and camera at the end.  After the procedure, it is common to have some soreness or pain in your abdomen and urethra.  Watch for any blood in your urine. If the amount of blood in your urine increases, call your health care provider.  If you were prescribed an antibiotic medicine, take it as told by your health care provider. Do not stop taking the antibiotic even if you start to feel better. This  information is not intended to replace advice given to you by your health care provider. Make sure you discuss any questions you have with your health care provider. Document Revised: 02/22/2018 Document Reviewed: 02/22/2018 Elsevier Patient Education  2020 [employer] Inc.  Transrectal Ultrasound Transrectal ultrasound is a procedure that uses sound waves to create images of your prostate gland and nearby tissues. The sound waves are sent through the wall of your rectum into your prostate gland, which is located in front of your rectum. The images show the size and shape of your prostate gland and nearby structures. You may have this test if you have:  Trouble urinating.  Infertility.  An abnormal prostate screening exam. Tell a health care provider about:  Any allergies you have.  All medicines you are taking, including vitamins, herbs, eye drops, creams, and over-the-counter medicines.  Any blood disorders you have.  Any medical conditions you have.  Any surgeries you have had. What are the risks? Generally, this is a safe procedure. However, problems may occur, including:  Discomfort during the procedure. This is rare.  Blood in your urine or sperm after the procedure. This is rare. What happens before the procedure?  Your health care provider may instruct you to use an enema 1-4 hours before the procedure. Follow instructions from your health care provider about how to do the enema.  Ask your health care provider about changing or stopping your regular medicines. This is especially important if you are taking diabetes medicines or blood thinners. What happens during the procedure?  You will be asked to lie down on your left side on an examination table.  You will bend your knees toward your chest.  A lubricated probe will be gently inserted into your rectum. This may cause a feeling of fullness.  The probe will send signals to a computer that will create images.  The  technician will slightly rotate the probe throughout the procedure. While rotating the probe, he or she will view and capture images of the prostate gland and the surrounding structures from different angles.  The probe will be removed. The procedure may vary among health care providers and hospitals. What happens after the procedure?  It is up to you to get the results of your procedure. Ask your health care provider, or the department that is doing the procedure, when your results will be ready. Summary  Transrectal ultrasound is a procedure that uses sound waves to create images of your prostate gland and nearby tissues.  The images show the size and shape of your prostate gland and nearby structures.  Before the procedure, ask your health care provider about changing or stopping your regular medicines. This is especially important if you are taking diabetes medicines or blood thinners. This information is not intended to replace advice given to you by your health care provider. Make sure you discuss any questions you have with your health care provider. Document Revised: 02/12/2017 Document Reviewed: 01/24/2016 Elsevier Patient Education  2020 Elsevier Inc.  

## 2019-11-21 NOTE — Progress Notes (Signed)
11/22/2019  Chief Complaint  Patient presents with   Cysto    TRUS     HPI: Jonathan Small is a 59 y.o. male who returns for a cystoscopy and TRUS.    Please see previous notes for details.      Blood pressure (!) 146/90, pulse 82, height 5\' 8"  (1.727 m), weight 181 lb (82.1 kg). NED. A&Ox3.   No respiratory distress   Abd soft, NT, ND Normal phallus with bilateral descended testicles    Cystoscopy Procedure Note  Patient identification was confirmed, informed consent was obtained, and patient was prepped using Betadine solution.  Lidocaine jelly was administered per urethral meatus.    Preoperative abx where received prior to procedure.     Pre-Procedure: - Inspection reveals a normal caliber ureteral meatus.  Procedure: The flexible cystoscope was introduced without difficulty - No urethral strictures/lesions are present. - Normal prostate  - Normal bladder neck - Bilateral ureteral orifices identified - Bladder mucosa reveals superficial capillary lesion measuring 1 cm in size just beyond the right UO highly concerning for TCC, no ulcers, tumors. - No bladder stones - No trabeculation  Retroflexion shows no abnormalities.    Post-Procedure: - Patient tolerated the procedure well    Prostate transrectal ultrasound sizing   Informed consent was obtained after discussing risks/benefits of the procedure.  A time out was performed to ensure correct patient identity.   Pre-Procedure: -Transrectal probe was placed without difficulty -Transrectal Ultrasound performed revealing a 28.4 gm prostate measuring 4.11 x 3.23 x 4.09 cm (length) -No significant hypoechoic or median lobe noted      Assessment/ Plan:  1. Bladder cancer, right lateral bladder wall Recommend performing a TURBT along with a bilateral retrograde pyelogram and intravesical gemcitabine.  Risk of bleeding, infection, bladder irritation, bladder perforation amongst others were discussed. Will  plan for a repeat RUS.   2. BPH with LUTS  The patient is a good candidate for Urolift but I would like to hold off on this for now. His prostate is relatively solid, question whether his irritative voiding symptoms may be related to this incidental bladder cancer finding   I, Selena Batten, am acting as a scribe for Dr. Hollice Espy.  I have reviewed the above documentation for accuracy and completeness, and I agree with the above.   Hollice Espy, MD

## 2019-11-21 NOTE — H&P (View-Only) (Signed)
11/22/2019  Chief Complaint  Patient presents with  . Cysto    TRUS     HPI: Jonathan Small is a 59 y.o. male who returns for a cystoscopy and TRUS.    Please see previous notes for details.      Blood pressure (!) 146/90, pulse 82, height 5\' 8"  (1.727 m), weight 181 lb (82.1 kg). NED. A&Ox3.   No respiratory distress   Abd soft, NT, ND Normal phallus with bilateral descended testicles    Cystoscopy Procedure Note  Patient identification was confirmed, informed consent was obtained, and patient was prepped using Betadine solution.  Lidocaine jelly was administered per urethral meatus.    Preoperative abx where received prior to procedure.     Pre-Procedure: - Inspection reveals a normal caliber ureteral meatus.  Procedure: The flexible cystoscope was introduced without difficulty - No urethral strictures/lesions are present. - Normal prostate  - Normal bladder neck - Bilateral ureteral orifices identified - Bladder mucosa reveals superficial capillary lesion measuring 1 cm in size just beyond the right UO highly concerning for TCC, no ulcers, tumors. - No bladder stones - No trabeculation  Retroflexion shows no abnormalities.    Post-Procedure: - Patient tolerated the procedure well    Prostate transrectal ultrasound sizing   Informed consent was obtained after discussing risks/benefits of the procedure.  A time out was performed to ensure correct patient identity.   Pre-Procedure: -Transrectal probe was placed without difficulty -Transrectal Ultrasound performed revealing a 28.4 gm prostate measuring 4.11 x 3.23 x 4.09 cm (length) -No significant hypoechoic or median lobe noted      Assessment/ Plan:  1. Bladder cancer, right lateral bladder wall Recommend performing a TURBT along with a bilateral retrograde pyelogram and intravesical gemcitabine.  Risk of bleeding, infection, bladder irritation, bladder perforation amongst others were discussed. Will  plan for a repeat RUS.   2. BPH with LUTS  The patient is a good candidate for Urolift but I would like to hold off on this for now. His prostate is relatively solid, question whether his irritative voiding symptoms may be related to this incidental bladder cancer finding   I, Selena Batten, am acting as a scribe for Dr. Hollice Espy.  I have reviewed the above documentation for accuracy and completeness, and I agree with the above.   Hollice Espy, MD

## 2019-11-22 ENCOUNTER — Other Ambulatory Visit: Payer: Self-pay | Admitting: Radiology

## 2019-11-22 ENCOUNTER — Encounter: Payer: Self-pay | Admitting: Urology

## 2019-11-22 ENCOUNTER — Ambulatory Visit (INDEPENDENT_AMBULATORY_CARE_PROVIDER_SITE_OTHER): Payer: BC Managed Care – PPO | Admitting: Urology

## 2019-11-22 ENCOUNTER — Other Ambulatory Visit: Payer: Self-pay

## 2019-11-22 VITALS — BP 146/90 | HR 82 | Ht 68.0 in | Wt 181.0 lb

## 2019-11-22 DIAGNOSIS — R3911 Hesitancy of micturition: Secondary | ICD-10-CM

## 2019-11-22 DIAGNOSIS — D494 Neoplasm of unspecified behavior of bladder: Secondary | ICD-10-CM

## 2019-11-22 DIAGNOSIS — N401 Enlarged prostate with lower urinary tract symptoms: Secondary | ICD-10-CM | POA: Diagnosis not present

## 2019-11-22 DIAGNOSIS — C672 Malignant neoplasm of lateral wall of bladder: Secondary | ICD-10-CM | POA: Diagnosis not present

## 2019-11-22 MED ORDER — GEMCITABINE CHEMO FOR BLADDER INSTILLATION 2000 MG: 2000.0000 mg | Freq: Once | INTRAVENOUS | Status: DC

## 2019-11-22 NOTE — Patient Instructions (Signed)
Transurethral Resection of Bladder Tumor  Transurethral resection of a bladder tumor is the removal (resection) of a cancerous growth (tumor) on the inside wall of the bladder. The bladder is the organ that holds urine. The tumor is removed through the tube that carries urine out of the body (urethra). In a transurethral resection, a thin telescope with a light, a tiny camera, and an electric cutting edge (resectoscope) is passed through the urethra. In men, the opening of the urethra is at the end of the penis. In women, it is just above the opening of the vagina. Tell a health care provider about:  Any allergies you have.  All medicines you are taking, including vitamins, herbs, eye drops, creams, and over-the-counter medicines.  Any problems you or family members have had with anesthetic medicines.  Any blood disorders you have.  Any surgeries you have had.  Any medical conditions you have.  Any recent urinary tract infections you have had.  Whether you are pregnant or may be pregnant. What are the risks? Generally, this is a safe procedure. However, problems may occur, including:  Infection.  Bleeding.  Allergic reactions to medicines.  Damage to nearby structures or organs, such as: ? The urethra. ? The tubes that drain urine from the kidneys into the bladder (ureters).  Pain and burning during urination.  Difficulty urinating due to partial blockage of the urethra.  Inability to urinate (urinary retention). What happens before the procedure? Staying hydrated Follow instructions from your health care provider about hydration, which may include:  Up to 2 hours before the procedure - you may continue to drink clear liquids, such as water, clear fruit juice, black coffee, and plain tea.  Eating and drinking restrictions Follow instructions from your health care provider about eating and drinking, which may include:  8 hours before the procedure - stop eating heavy  meals or foods, such as meat, fried foods, or fatty foods.  6 hours before the procedure - stop eating light meals or foods, such as toast or cereal.  6 hours before the procedure - stop drinking milk or drinks that contain milk.  2 hours before the procedure - stop drinking clear liquids. Medicines Ask your health care provider about:  Changing or stopping your regular medicines. This is especially important if you are taking diabetes medicines or blood thinners.  Taking medicines such as aspirin and ibuprofen. These medicines can thin your blood. Do not take these medicines unless your health care provider tells you to take them.  Taking over-the-counter medicines, vitamins, herbs, and supplements. Tests You may have exams or tests, including:  Physical exam.  Blood tests.  Urine tests.  Electrocardiogram (ECG). This test measures the electrical activity of the heart. General instructions  Plan to have someone take you home from the hospital or clinic.  Ask your health care provider how your surgical site will be marked or identified.  Ask your health care provider what steps will be taken to help prevent infection. These may include: ? Washing skin with a germ-killing soap. ? Taking antibiotic medicine. What happens during the procedure?  An IV will be inserted into one of your veins.  You will be given one or more of the following: ? A medicine to help you relax (sedative). ? A medicine to make you fall asleep (general anesthetic). ? A medicine that is injected into your spine to numb the area below and slightly above the injection site (spinal anesthetic).  Your legs will be   placed in foot rests (stirrups) so that your legs are apart and your knees are bent.  The resectoscope will be passed through your urethra and into your bladder.  The part of your bladder that is affected by the tumor will be resected using the cutting edge of the resectoscope.  The  resectoscope will be removed.  A thin, flexible tube (catheter) will be passed through your urethra and into your bladder. The catheter will drain urine into a bag outside of your body. ? Fluid may be passed through the catheter to keep the catheter open. The procedure may vary among health care providers and hospitals. What happens after the procedure?  Your blood pressure, heart rate, breathing rate, and blood oxygen level will be monitored until you leave the hospital or clinic.  You may continue to receive fluids and medicines through an IV.  You will have some pain. You will be given pain medicine to relieve pain.  You will have a catheter to drain your urine. ? You will have blood in your urine. Your catheter may be kept in until your urine is clear. ? The amount of urine will be monitored. If necessary, your bladder may be rinsed out (irrigated) by passing fluid through your catheter.  You will be encouraged to walk around as soon as possible.  You may have to wear compression stockings. These stockings help to prevent blood clots and reduce swelling in your legs.  Do not drive for 24 hours if you were given a sedative during your procedure. Summary  Transurethral resection of a bladder tumor is the removal (resection) of a cancerous growth (tumor) on the inside wall of the bladder.  To do this procedure, your health care provider uses a thin telescope with a light, a tiny camera, and an electric cutting edge (resectoscope).  Follow your health care provider's instructions. You may need to stop or change certain medicines, and you may be told to stop eating and drinking several hours before the procedure.  Your blood pressure, heart rate, breathing rate, and blood oxygen level will be monitored until you leave the hospital or clinic.  You may have to wear compression stockings. These stockings help to prevent blood clots and reduce swelling in your legs. This information is  not intended to replace advice given to you by your health care provider. Make sure you discuss any questions you have with your health care provider. Document Revised: 10/01/2017 Document Reviewed: 10/01/2017 Elsevier Patient Education  2020 [employer]  

## 2019-11-23 LAB — URINALYSIS, COMPLETE
Bilirubin, UA: NEGATIVE
Glucose, UA: NEGATIVE
Ketones, UA: NEGATIVE
Leukocytes,UA: NEGATIVE
Nitrite, UA: NEGATIVE
Protein,UA: NEGATIVE
RBC, UA: NEGATIVE
Specific Gravity, UA: 1.02 (ref 1.005–1.030)
Urobilinogen, Ur: 0.2 mg/dL (ref 0.2–1.0)
pH, UA: 7 (ref 5.0–7.5)

## 2019-11-23 LAB — MICROSCOPIC EXAMINATION
Bacteria, UA: NONE SEEN
Epithelial Cells (non renal): NONE SEEN /hpf (ref 0–10)
RBC, Urine: NONE SEEN /hpf (ref 0–2)

## 2019-11-27 ENCOUNTER — Other Ambulatory Visit: Payer: Self-pay

## 2019-11-27 ENCOUNTER — Encounter
Admission: RE | Admit: 2019-11-27 | Discharge: 2019-11-27 | Disposition: A | Discharge status: Home / Self Care | Payer: BC Managed Care – PPO | Source: Ambulatory Visit | Attending: Urology | Admitting: Urology

## 2019-11-27 HISTORY — DX: Malignant (primary) neoplasm, unspecified: C80.1

## 2019-11-27 HISTORY — DX: Thyrotoxicosis, unspecified without thyrotoxic crisis or storm: E05.90

## 2019-11-27 HISTORY — DX: Family history of other specified conditions: Z84.89

## 2019-11-27 HISTORY — DX: Other complications of anesthesia, initial encounter: T88.59XA

## 2019-11-27 HISTORY — DX: Other specified postprocedural states: Z98.890

## 2019-11-27 HISTORY — DX: Other specified postprocedural states: R11.2

## 2019-11-27 HISTORY — DX: Gastro-esophageal reflux disease without esophagitis: K21.9

## 2019-11-27 HISTORY — DX: Nausea with vomiting, unspecified: R11.2

## 2019-11-27 NOTE — Patient Instructions (Addendum)
Your procedure is scheduled on: 11-30-19 THURSDAY Report to Same Day Surgery 2nd floor medical mall [facility] Entrance-take elevator on left to 2nd floor.  Check in with surgery information desk.) To find out your arrival time please call [phone] between 1PM - 3PM on 11-29-19 [facility]  Remember: Instructions that are not followed completely may result in serious medical risk, up to and including death, or upon the discretion of your surgeon and anesthesiologist your surgery may need to be rescheduled.    _x___ 1. Do not eat food after midnight the night before your procedure. NO GUM OR CANDY AFTER MIDNIGHT. You may drink clear liquids up to 2 hours before you are scheduled to arrive at the hospital for your procedure.  Do not drink clear liquids within 2 hours of your scheduled arrival to the hospital.  Clear liquids include  --Water or Apple juice without pulp  --Gatorade  --Black Coffee or Clear Tea (No milk, no creamers, do not add anything to  the coffee or Tea-ok to add sugar)    __x__ 2. No Alcohol for 24 hours before or after surgery.   __x__3. No Smoking or e-cigarettes for 24 prior to surgery.  Do not use any chewable tobacco products for at least 6 hour prior to surgery   ____  4. Bring all medications with you on the day of surgery if instructed.    __x__ 5. Notify your doctor if there is any change in your medical condition     (cold, fever, infections).    x___6. On the morning of surgery brush your teeth with toothpaste and water.  You may rinse your mouth with mouth wash if you wish.  Do not swallow any toothpaste or mouthwash.   Do not wear jewelry, make-up, hairpins, clips or nail polish.  Do not wear lotions, powders, or perfumes. You may wear deodorant.  Do not shave 48 hours prior to surgery. Men may shave face and neck.  Do not bring valuables to the hospital.    [facility] is not responsible for any belongings or valuables.               Contacts,  dentures or bridgework may not be worn into surgery.  Leave your suitcase in the car. After surgery it may be brought to your room.  For patients admitted to the hospital, discharge time is determined by your treatment team.  _  Patients discharged the day of surgery will not be allowed to drive home.  You will need someone to drive you home and stay with you the night of your procedure.    Please read over the following fact sheets that you were given:   [facility] Preparing for Surgery   _x___ TAKE THE FOLLOWING MEDICATION THE MORNING OF SURGERY WITH A SMALL SIP OF WATER. These include:  1. ZOCOR (SIMVASTATIN)  2. METHIMAZOLE (TAPIZOLE)  3.  4.  5.  6.  ____Fleets enema or Magnesium Citrate as directed.   ____ Use CHG Soap as directed on instruction sheet   ____ Use inhalers on the day of surgery and bring to hospital day of surgery  ____ Stop Metformin and Janumet 2 days prior to surgery.    ____ Take 1/2 of usual insulin dose the night before surgery and none on the morning  surgery.   ____ Follow recommendations from Cardiologist, Pulmonologist or PCP regarding  stopping Aspirin, Coumadin, Plavix ,Eliquis, Effient, or Pradaxa, and Pletal.  X____Stop Anti-inflammatories such as Advil,  Aleve, Ibuprofen, Motrin, Naproxen, Naprosyn, Goodies powders or aspirin products NOW-OK to take Tylenol    ____ Stop supplements until after surgery.     ____ Bring C-Pap to the hospital.

## 2019-11-28 ENCOUNTER — Other Ambulatory Visit: Payer: BC Managed Care – PPO

## 2019-11-28 ENCOUNTER — Encounter
Admission: RE | Admit: 2019-11-28 | Discharge: 2019-11-28 | Disposition: A | Discharge status: Home / Self Care | Payer: BC Managed Care – PPO | Source: Ambulatory Visit | Attending: Urology | Admitting: Urology

## 2019-11-28 DIAGNOSIS — Z01818 Encounter for other preprocedural examination: Secondary | ICD-10-CM | POA: Diagnosis not present

## 2019-11-28 DIAGNOSIS — I10 Essential (primary) hypertension: Secondary | ICD-10-CM | POA: Diagnosis not present

## 2019-11-28 DIAGNOSIS — Z20822 Contact with and (suspected) exposure to covid-19: Secondary | ICD-10-CM | POA: Diagnosis not present

## 2019-11-28 LAB — BASIC METABOLIC PANEL
Anion gap: 11 (ref 5–15)
BUN: 11 mg/dL (ref 6–20)
CO2: 26 mmol/L (ref 22–32)
Calcium: 9.4 mg/dL (ref 8.9–10.3)
Chloride: 94 mmol/L — ABNORMAL LOW (ref 98–111)
Creatinine, Ser: 1.31 mg/dL — ABNORMAL HIGH (ref 0.61–1.24)
GFR calc Af Amer: >60 mL/min (ref >60)
GFR calc non Af Amer: >60 mL/min (ref >60)
Glucose, Bld: 107 mg/dL — ABNORMAL HIGH (ref 70–99)
Potassium: 4.1 mmol/L (ref 3.5–5.1)
Sodium: 131 mmol/L — ABNORMAL LOW (ref 135–145)

## 2019-11-28 LAB — SARS CORONAVIRUS 2 (TAT 6-24 HRS): SARS Coronavirus 2: NEGATIVE

## 2019-11-28 LAB — CULTURE, URINE COMPREHENSIVE

## 2019-11-28 NOTE — Progress Notes (Signed)
  [facility] Perioperative Services: Pre-Admission/Anesthesia Testing  Abnormal Lab Notification   Date: 11/28/19  Name: Jonathan Small MRN:   [id number]  Re: Abnormal labs noted during PAT appointment   Provider(s) Notified: Hollice Espy, MD Notification mode: Routed and/or faxed via [facility]   ABNORMAL LAB VALUE(S): Lab Results  Component Value Date   NA 131 (L) 11/28/2019   Notes:  Patient is on thiazide diuretic for HTN and Elavil, both of which could account of his mild hyponatremia. Patient scheduled for a TURBT with intravesical chemotherapy on 11/30/2019. This is a Community education officer; no formal response is required.  Honor Loh, MSN, APRN, FNP-C, CEN [facility]  Peri-operative Services Nurse Practitioner Phone: [phone] 11/28/19 3:23 PM

## 2019-11-30 ENCOUNTER — Other Ambulatory Visit: Payer: Self-pay

## 2019-11-30 ENCOUNTER — Encounter: Payer: Self-pay | Admitting: Urology

## 2019-11-30 ENCOUNTER — Encounter
Admission: RE | Disposition: A | Discharge status: Home / Self Care | Payer: Self-pay | Source: Home / Self Care | Attending: Urology

## 2019-11-30 ENCOUNTER — Telehealth: Payer: Self-pay | Admitting: Urology

## 2019-11-30 ENCOUNTER — Ambulatory Visit: Payer: BC Managed Care – PPO | Admitting: Urgent Care

## 2019-11-30 ENCOUNTER — Encounter: Payer: Self-pay | Admitting: Emergency Medicine

## 2019-11-30 ENCOUNTER — Ambulatory Visit
Admission: RE | Admit: 2019-11-30 | Discharge: 2019-11-30 | Disposition: A | Discharge status: Home / Self Care | Payer: BC Managed Care – PPO | Attending: Urology | Admitting: Urology

## 2019-11-30 ENCOUNTER — Ambulatory Visit: Payer: BC Managed Care – PPO

## 2019-11-30 DIAGNOSIS — C679 Malignant neoplasm of bladder, unspecified: Secondary | ICD-10-CM | POA: Diagnosis not present

## 2019-11-30 DIAGNOSIS — C672 Malignant neoplasm of lateral wall of bladder: Secondary | ICD-10-CM | POA: Diagnosis not present

## 2019-11-30 DIAGNOSIS — N401 Enlarged prostate with lower urinary tract symptoms: Secondary | ICD-10-CM | POA: Diagnosis not present

## 2019-11-30 DIAGNOSIS — Z8551 Personal history of malignant neoplasm of bladder: Secondary | ICD-10-CM | POA: Insufficient documentation

## 2019-11-30 DIAGNOSIS — Z5321 Procedure and treatment not carried out due to patient leaving prior to being seen by health care provider: Secondary | ICD-10-CM | POA: Insufficient documentation

## 2019-11-30 DIAGNOSIS — D494 Neoplasm of unspecified behavior of bladder: Secondary | ICD-10-CM | POA: Diagnosis not present

## 2019-11-30 DIAGNOSIS — R319 Hematuria, unspecified: Secondary | ICD-10-CM | POA: Insufficient documentation

## 2019-11-30 HISTORY — PX: CYSTOSCOPY W/ RETROGRADES: SHX1426

## 2019-11-30 HISTORY — PX: TRANSURETHRAL RESECTION OF BLADDER TUMOR WITH MITOMYCIN-C: SHX6459

## 2019-11-30 LAB — CBC
HCT: 40 % (ref 39.0–52.0)
Hemoglobin: 13.9 g/dL (ref 13.0–17.0)
MCH: 34 pg (ref 26.0–34.0)
MCHC: 34.8 g/dL (ref 30.0–36.0)
MCV: 97.8 fL (ref 80.0–100.0)
Platelets: 365 10*3/uL (ref 150–400)
RBC: 4.09 MIL/uL — ABNORMAL LOW (ref 4.22–5.81)
RDW: 11.8 % (ref 11.5–15.5)
WBC: 13.2 10*3/uL — ABNORMAL HIGH (ref 4.0–10.5)
nRBC: 0 % (ref 0.0–0.2)

## 2019-11-30 LAB — BASIC METABOLIC PANEL
Anion gap: 10 (ref 5–15)
BUN: 17 mg/dL (ref 6–20)
CO2: 22 mmol/L (ref 22–32)
Calcium: 8.9 mg/dL (ref 8.9–10.3)
Chloride: 100 mmol/L (ref 98–111)
Creatinine, Ser: 1.56 mg/dL — ABNORMAL HIGH (ref 0.61–1.24)
GFR calc Af Amer: 56 mL/min — ABNORMAL LOW (ref >60)
GFR calc non Af Amer: 49 mL/min — ABNORMAL LOW (ref >60)
Glucose, Bld: 138 mg/dL — ABNORMAL HIGH (ref 70–99)
Potassium: 4.9 mmol/L (ref 3.5–5.1)
Sodium: 132 mmol/L — ABNORMAL LOW (ref 135–145)

## 2019-11-30 SURGERY — TRANSURETHRAL RESECTION OF BLADDER TUMOR WITH MITOMYCIN-C
Anesthesia: General

## 2019-11-30 MED ORDER — FENTANYL CITRATE (PF) 100 MCG/2ML IJ SOLN
25.0000 ug | INTRAMUSCULAR | Status: DC | PRN
  Administered 2019-11-30: 25 ug via INTRAVENOUS

## 2019-11-30 MED ORDER — OXYBUTYNIN CHLORIDE 5 MG PO TABS: 5.0000 mg | ORAL_TABLET | Freq: Three times a day (TID) | ORAL | 0 refills | Status: DC | PRN

## 2019-11-30 MED ORDER — DEXAMETHASONE SODIUM PHOSPHATE 10 MG/ML IJ SOLN
INTRAMUSCULAR | Status: AC
  Filled 2019-11-30: qty 1

## 2019-11-30 MED ORDER — MIDAZOLAM HCL 2 MG/2ML IJ SOLN: 1.0000 mg | Freq: Once | INTRAMUSCULAR | Status: AC

## 2019-11-30 MED ORDER — FAMOTIDINE 20 MG PO TABS
ORAL_TABLET | ORAL | Status: AC
  Administered 2019-11-30: 20 mg via ORAL
  Filled 2019-11-30: qty 1

## 2019-11-30 MED ORDER — LIDOCAINE HCL (CARDIAC) PF 100 MG/5ML IV SOSY
PREFILLED_SYRINGE | INTRAVENOUS | Status: DC | PRN
  Administered 2019-11-30: 100 mg via INTRAVENOUS

## 2019-11-30 MED ORDER — PHENAZOPYRIDINE HCL 200 MG PO TABS: 200.0000 mg | ORAL_TABLET | Freq: Three times a day (TID) | ORAL | 0 refills | Status: DC | PRN

## 2019-11-30 MED ORDER — DEXAMETHASONE SODIUM PHOSPHATE 10 MG/ML IJ SOLN
INTRAMUSCULAR | Status: DC | PRN
  Administered 2019-11-30: 10 mg via INTRAVENOUS

## 2019-11-30 MED ORDER — MIDAZOLAM HCL 2 MG/2ML IJ SOLN
INTRAMUSCULAR | Status: AC
  Administered 2019-11-30: 1 mg via INTRAVENOUS
  Filled 2019-11-30: qty 2

## 2019-11-30 MED ORDER — FENTANYL CITRATE (PF) 100 MCG/2ML IJ SOLN
INTRAMUSCULAR | Status: AC
  Filled 2019-11-30: qty 2

## 2019-11-30 MED ORDER — LACTATED RINGERS IV SOLN: INTRAVENOUS | Status: DC

## 2019-11-30 MED ORDER — CHLORHEXIDINE GLUCONATE 0.12 % MT SOLN: 15.0000 mL | Freq: Once | OROMUCOSAL | Status: AC

## 2019-11-30 MED ORDER — CEFAZOLIN SODIUM-DEXTROSE 2-4 GM/100ML-% IV SOLN
INTRAVENOUS | Status: AC
  Filled 2019-11-30: qty 100

## 2019-11-30 MED ORDER — CHLORHEXIDINE GLUCONATE 0.12 % MT SOLN
OROMUCOSAL | Status: AC
  Administered 2019-11-30: 15 mL via OROMUCOSAL
  Filled 2019-11-30: qty 15

## 2019-11-30 MED ORDER — FENTANYL CITRATE (PF) 100 MCG/2ML IJ SOLN
INTRAMUSCULAR | Status: AC
  Administered 2019-11-30: 25 ug via INTRAVENOUS
  Filled 2019-11-30: qty 2

## 2019-11-30 MED ORDER — ORAL CARE MOUTH RINSE: 15.0000 mL | Freq: Once | OROMUCOSAL | Status: AC

## 2019-11-30 MED ORDER — SCOPOLAMINE 1 MG/3DAYS TD PT72
MEDICATED_PATCH | TRANSDERMAL | Status: AC
  Filled 2019-11-30: qty 1

## 2019-11-30 MED ORDER — LIDOCAINE HCL (PF) 2 % IJ SOLN
INTRAMUSCULAR | Status: AC
  Filled 2019-11-30: qty 5

## 2019-11-30 MED ORDER — MIDAZOLAM HCL 2 MG/2ML IJ SOLN
1.0000 mg | Freq: Once | INTRAMUSCULAR | Status: AC
  Administered 2019-11-30: 1 mg via INTRAVENOUS

## 2019-11-30 MED ORDER — ONDANSETRON HCL 4 MG/2ML IJ SOLN: 4.0000 mg | Freq: Once | INTRAMUSCULAR | Status: DC | PRN

## 2019-11-30 MED ORDER — FAMOTIDINE 20 MG PO TABS: 20.0000 mg | ORAL_TABLET | Freq: Once | ORAL | Status: AC

## 2019-11-30 MED ORDER — ONDANSETRON HCL 4 MG/2ML IJ SOLN
INTRAMUSCULAR | Status: AC
  Filled 2019-11-30: qty 2

## 2019-11-30 MED ORDER — CEFAZOLIN SODIUM-DEXTROSE 2-4 GM/100ML-% IV SOLN
2.0000 g | INTRAVENOUS | Status: AC
  Administered 2019-11-30: 2 g via INTRAVENOUS

## 2019-11-30 MED ORDER — HYDROCODONE-ACETAMINOPHEN 5-325 MG PO TABS: 1.0000 | ORAL_TABLET | Freq: Four times a day (QID) | ORAL | 0 refills | Status: DC | PRN

## 2019-11-30 MED ORDER — PROPOFOL 10 MG/ML IV BOLUS
INTRAVENOUS | Status: AC
  Filled 2019-11-30: qty 20

## 2019-11-30 MED ORDER — ONDANSETRON HCL 4 MG/2ML IJ SOLN
INTRAMUSCULAR | Status: DC | PRN
  Administered 2019-11-30: 4 mg via INTRAVENOUS

## 2019-11-30 MED ORDER — PROPOFOL 10 MG/ML IV BOLUS
INTRAVENOUS | Status: DC | PRN
  Administered 2019-11-30: 150 mg via INTRAVENOUS

## 2019-11-30 SURGICAL SUPPLY — 37 items
BAG DRAIN CYSTO-URO LG1000N (MISCELLANEOUS) ×4 IMPLANT
BAG DRN RND TRDRP ANRFLXCHMBR (UROLOGICAL SUPPLIES) ×2
BAG URINE DRAIN 2000ML AR STRL (UROLOGICAL SUPPLIES) ×4 IMPLANT
BRUSH SCRUB EZ  4% CHG (MISCELLANEOUS) ×4
BRUSH SCRUB EZ 1% IODOPHOR (MISCELLANEOUS) ×4 IMPLANT
BRUSH SCRUB EZ 4% CHG (MISCELLANEOUS) ×2 IMPLANT
CATH FOLEY 2WAY  5CC 16FR (CATHETERS) ×4
CATH FOLEY 2WAY 5CC 16FR (CATHETERS) ×2
CATH URETL 5X70 OPEN END (CATHETERS) ×4 IMPLANT
CATH URTH 16FR FL 2W BLN LF (CATHETERS) ×2 IMPLANT
DRAPE UTILITY 15X26 TOWEL STRL (DRAPES) ×4 IMPLANT
DRSG TELFA 4X3 1S NADH ST (GAUZE/BANDAGES/DRESSINGS) ×4 IMPLANT
ELECT LOOP 22F BIPOLAR SML (ELECTROSURGICAL)
ELECT REM PT RETURN 9FT ADLT (ELECTROSURGICAL)
ELECTRODE LOOP 22F BIPOLAR SML (ELECTROSURGICAL) IMPLANT
ELECTRODE REM PT RTRN 9FT ADLT (ELECTROSURGICAL) IMPLANT
GLOVE BIO SURGEON STRL SZ 6.5 (GLOVE) ×3 IMPLANT
GLOVE BIO SURGEONS STRL SZ 6.5 (GLOVE) ×1
GOWN STRL REUS W/ TWL LRG LVL3 (GOWN DISPOSABLE) ×4 IMPLANT
GOWN STRL REUS W/TWL LRG LVL3 (GOWN DISPOSABLE) ×8
GUIDEWIRE STR DUAL SENSOR (WIRE) ×4 IMPLANT
IV NS IRRIG 3000ML ARTHROMATIC (IV SOLUTION) IMPLANT
KIT TURNOVER CYSTO (KITS) ×4 IMPLANT
LOOP CUT BIPOLAR 24F LRG (ELECTROSURGICAL) IMPLANT
NDL SAFETY ECLIPSE 18X1.5 (NEEDLE) ×2 IMPLANT
NEEDLE HYPO 18GX1.5 SHARP (NEEDLE) ×4
PACK CYSTO AR (MISCELLANEOUS) ×4 IMPLANT
PAD ARMBOARD 7.5X6 YLW CONV (MISCELLANEOUS) ×4 IMPLANT
SET CYSTO W/LG BORE CLAMP LF (SET/KITS/TRAYS/PACK) ×4 IMPLANT
SET IRRIG Y TYPE TUR BLADDER L (SET/KITS/TRAYS/PACK) ×4 IMPLANT
SOL .9 NS 3000ML IRR  AL (IV SOLUTION) ×4
SOL .9 NS 3000ML IRR AL (IV SOLUTION) ×2
SOL .9 NS 3000ML IRR UROMATIC (IV SOLUTION) ×2 IMPLANT
SURGILUBE 2OZ TUBE FLIPTOP (MISCELLANEOUS) ×4 IMPLANT
SYRINGE IRR TOOMEY STRL 70CC (SYRINGE) ×4 IMPLANT
WATER STERILE IRR 1000ML POUR (IV SOLUTION) ×4 IMPLANT
WATER STERILE IRR 3000ML UROMA (IV SOLUTION) IMPLANT

## 2019-11-30 NOTE — Discharge Instructions (Addendum)
Transurethral Resection of Bladder Tumor (TURBT) or Bladder Biopsy ° ° °Definition: ° Transurethral Resection of the Bladder Tumor is a surgical procedure used to diagnose and remove tumors within the bladder. TURBT is the most common treatment for early stage bladder cancer. ° °General instructions: °   ° Your recent bladder surgery requires very little post hospital care but some definite precautions. ° °Despite the fact that no skin incisions were used, the area around the bladder incisions are raw and covered with scabs to promote healing and prevent bleeding. Certain precautions are needed to insure that the scabs are not disturbed over the next 2-4 weeks while the healing proceeds. ° °Because the raw surface inside your bladder and the irritating effects of urine you may expect frequency of urination and/or urgency (a stronger desire to urinate) and perhaps even getting up at night more often. This will usually resolve or improve slowly over the healing period. You may see some blood in your urine over the first 6 weeks. Do not be alarmed, even if the urine was clear for a while. Get off your feet and drink lots of fluids until clearing occurs. If you start to pass clots or don't improve call us. ° °Diet: ° °You may return to your normal diet immediately. Because of the raw surface of your bladder, alcohol, spicy foods, foods high in acid and drinks with caffeine may cause irritation or frequency and should be used in moderation. To keep your urine flowing freely and avoid constipation, drink plenty of fluids during the day (8-10 glasses). Tip: Avoid cranberry juice because it is very acidic. ° °Activity: ° °Your physical activity doesn't need to be restricted. However, if you are very active, you may see some blood in the urine. We suggest that you reduce your activity under the circumstances until the bleeding has stopped. ° °Bowels: ° °It is important to keep your bowels regular during the postoperative  period. Straining with bowel movements can cause bleeding. A bowel movement every other day is reasonable. Use a mild laxative if needed, such as milk of magnesia 2-3 tablespoons, or 2 Dulcolax tablets. Call if you continue to have problems. If you had been taking narcotics for pain, before, during or after your surgery, you may be constipated. Take a laxative if necessary. ° ° ° °Medication: ° °You should resume your pre-surgery medications unless told not to. In addition you may be given an antibiotic to prevent or treat infection. Antibiotics are not always necessary. All medication should be taken as prescribed until the bottles are finished unless you are having an unusual reaction to one of the drugs. ° ° °[facility] °[place], [place] [address] °[phone] ° ° ° °AMBULATORY SURGERY  °DISCHARGE INSTRUCTIONS ° ° °1) The drugs that you were given will stay in your system until tomorrow so for the next 24 hours you should not: ° °A) Drive an automobile °B) Make any legal decisions °C) Drink any alcoholic beverage ° ° °2) You may resume regular meals tomorrow.  Today it is better to start with liquids and gradually work up to solid foods. ° °You may eat anything you prefer, but it is better to start with liquids, then soup and crackers, and gradually work up to solid foods. ° ° °3) Please notify your doctor immediately if you have any unusual bleeding, trouble breathing, redness and pain at the surgery site, drainage, fever, or pain not relieved by medication. ° ° ° °4) Additional Instructions: ° ° ° ° ° ° ° °  Please contact your physician with any problems or Same Day Surgery at [phone], Monday through Friday 6 am to 4 pm, or [place] [facility] [place] Main number at [phone]. ° °

## 2019-11-30 NOTE — Transfer of Care (Signed)
Immediate Anesthesia Transfer of Care Note  Patient: Jonathan Small  Procedure(s) Performed: TRANSURETHRAL RESECTION OF BLADDER TUMOR WITH Gemcitabine (N/A ) CYSTOSCOPY WITH RETROGRADE PYELOGRAM (Bilateral )  Patient Location: PACU  Anesthesia Type:General  Level of Consciousness: sedated  Airway & Oxygen Therapy: Patient Spontanous Breathing and Patient connected to face mask oxygen  Post-op Assessment: Report given to RN and Post -op Vital signs reviewed and stable  Post vital signs: Reviewed and stable  Last Vitals:  Vitals Value Taken Time  BP 127/77 11/30/19 1015  Temp 36.4 C 11/30/19 1015  Pulse 54 11/30/19 1017  Resp 13 11/30/19 1017  SpO2 100 % 11/30/19 1017  Vitals shown include unvalidated device data.  Last Pain:  Vitals:   11/30/19 1015  TempSrc:   PainSc: (P) 0-No pain      Patients Stated Pain Goal: 0 (35/82/51 [id number])  Complications: No complications documented.

## 2019-11-30 NOTE — OR Nursing (Signed)
Bladder scan 47 ml @ 230 pm; discussed w/pt at MD request.  States he feels comfortable going home.

## 2019-11-30 NOTE — ED Notes (Signed)
This RN advised pt to urinate, then will bladder scan again for post void residual.

## 2019-11-30 NOTE — OR Nursing (Signed)
No visit from Dr. Erlene Quan in pt postop room; MD had discussion with note writer and patient via tele.   MD advises ok to discharge patient to home, since he is able to void.  MD discussed with RN and patient via tele that blood likely a result of irritation with foley removal in pacu.  Will bladder scan for pt to see he is emptying his bladder as requested by MD.

## 2019-11-30 NOTE — Anesthesia Procedure Notes (Signed)
Procedure Name: LMA Insertion Date/Time: 11/30/2019 9:38 AM Performed by: Aline Brochure, CRNA Pre-anesthesia Checklist: Patient identified, Emergency Drugs available, Suction available and Patient being monitored Patient Re-evaluated:Patient Re-evaluated prior to induction Oxygen Delivery Method: Circle system utilized Preoxygenation: Pre-oxygenation with 100% oxygen Induction Type: IV induction Ventilation: Mask ventilation without difficulty LMA: LMA inserted LMA Size: 4.0 Number of attempts: 1 Placement Confirmation: ETT inserted through vocal cords under direct vision,  positive ETCO2 and breath sounds checked- equal and bilateral Tube secured with: Tape Dental Injury: Teeth and Oropharynx as per pre-operative assessment

## 2019-11-30 NOTE — Telephone Encounter (Signed)
Called by phase 2 nurse post op.  Patient is concerned about the degree of gross hematuria.  Intraope, bladder bleeding was extremely hemostatic.  In the PACU, there was some issues with his Foley catheter removal per the nurse.  Catheter was stuck, required reinflation of the balloon, deflation and removal.  At that time, there is some bleeding noted.  Suspect urethral trauma.  Mr. Cavell has been voiding successfully in the PACU.  Patient describes being able to void clear urine but then trickling of blood thereafter.  He voided 400 cc of maroon-colored urine without clots.  A picture of this was sent to me for visualization.  Asked nurse to BladderScan him to ensure that he is emptying well.  Had a lengthy discussion via telephone with the patient.  Option of reinsertion of the Foley catheter to help with tamponading of the bleeding versus expectant management as long as he is voiding and emptying, pushing fluids.  He like to the more conservative approach.  If he is unable to void overnight, he will present to the emergency room for catheter placement and irrigation if needed.  Hollice Espy, MD

## 2019-11-30 NOTE — ED Triage Notes (Addendum)
Pt in via POV, reports having procedure today due to bladder cancer, having issues with removal of catheter and since being home patient is urinating profuse amounts of blood.  Reports bleeding is continuous, having to wear a brief.  Urology, Erlene Quan advised pt be seen here due to concern for possible punctured urethra and blood clots.  Vitals WDL, NAD noted at this time.

## 2019-11-30 NOTE — Op Note (Signed)
Date of procedure: 11/30/19  Preoperative diagnosis:  1. Bladder tumor, right lateral bladder wall  Postoperative diagnosis:  1. Same as above  Procedure: 1. Cystoscopy 2. TURBT, small 3. Bilateral retrograde pyelogram 4. Instillation of intravesical gemcitabine  Surgeon: Hollice Espy, MD  Anesthesia: General  Complications: None  Intraoperative findings: Broad-based papillary tumor with erythema approximately 1 cm in diameter, carpeting-like appearance just beyond the right UO on the right lateral bladder wall.  No other tumors or lesions.  Bilateral retrograde unremarkable.  EBL: Minimal  Specimens: Right lateral bladder wall tumor  Drains: 16 French Foley catheter  Indication: Jonathan Small is a 59 y.o. patient with irritative voiding symptoms found to have an incidental bladder tumor on cystoscopy.  After reviewing the management options for treatment, he elected to proceed with the above surgical procedure(s). We have discussed the potential benefits and risks of the procedure, side effects of the proposed treatment, the likelihood of the patient achieving the goals of the procedure, and any potential problems that might occur during the procedure or recuperation. Informed consent has been obtained.  Description of procedure:  The patient was taken to the operating room and general anesthesia was induced.  The patient was placed in the dorsal lithotomy position, prepped and draped in the usual sterile fashion, and preoperative antibiotics were administered. A preoperative time-out was performed.   Male sounds were used to dilate the urethra.  61 French cystoscope was advanced per urethra into the bladder.  The bladder was carefully inspected.  There was some papillary tumor which is a carpeting-like appearance on a broad base with mild erythema measuring approximate 1 cm on the right lateral bladder wall just beyond the UO.  The UO was uninvolved.  The remainder of the  bladder was unremarkable and clear of any tumors lesions or masses.  Attention was turned to the left ureteral orifice which was cannulated and a 5 [place] open-ended ureteral catheter.  Gentle retrograde pyelogram was performed on the side which revealed no hydroureteronephrosis or filling defects.  The same exact procedure was performed on the right.  This is also unremarkable without hydronephrosis or filling defects.  Next, cold cup biopsy forceps were used to resect the tumor as well as the erythematous area and a piece wise fashion.  This was done approximately 5 or so bites.  The base of this lesion was then fulgurated extensively using Bugbee electrocautery as well as a somewhat beyond the margin with therapeutic intent.  Hemostasis was excellent.  The adjacent UO was draining well.  Finally, the scope was removed.  A 16 French Foley catheter was placed with a 10 cc balloon.  2000 mg of intravesical gemcitabine was instilled to the bladder which was allowed to dwell in the PACU for 1 hour.  After 1 hour, the chemotherapy was drained and the Foley was removed.  I will plan on calling the patient as soon as I receive his pathology results and will discuss to follow-up plan.  If his voiding symptoms fail to resolve, he may end up electing to pursue UroLift.  He will need cancer surveillance and possibly additional treatment pending  pathology.  Hollice Espy, M.D.

## 2019-11-30 NOTE — Anesthesia Preprocedure Evaluation (Signed)
Anesthesia Evaluation  Patient identified by MRN, date of birth, ID band Patient awake    Reviewed: Allergy & Precautions, H&P , NPO status , Patient's Chart, lab work & pertinent test results, reviewed documented beta blocker date and time   History of Anesthesia Complications (+) PONV, Family history of anesthesia reaction and history of anesthetic complications  Airway Mallampati: II  TM Distance: >3 FB Neck ROM: full    Dental  (+) Teeth Intact   Pulmonary neg pulmonary ROS, former smoker,    Pulmonary exam normal        Cardiovascular Exercise Tolerance: Good hypertension, On Medications negative cardio ROS Normal cardiovascular exam Rate:Normal     Neuro/Psych Seizures -, Well Controlled,  negative psych ROS   GI/Hepatic Neg liver ROS, GERD  Medicated,  Endo/Other  Hyperthyroidism   Renal/GU negative Renal ROS  negative genitourinary   Musculoskeletal   Abdominal   Peds  Hematology negative hematology ROS (+)   Anesthesia Other Findings   Reproductive/Obstetrics negative OB ROS                             Anesthesia Physical Anesthesia Plan  ASA: II  Anesthesia Plan: General LMA   Post-op Pain Management:    Induction:   PONV Risk Score and Plan: 4 or greater  Airway Management Planned:   Additional Equipment:   Intra-op Plan:   Post-operative Plan:   Informed Consent: I have reviewed the patients History and Physical, chart, labs and discussed the procedure including the risks, benefits and alternatives for the proposed anesthesia with the patient or authorized representative who has indicated his/her understanding and acceptance.       Plan Discussed with: CRNA  Anesthesia Plan Comments:         Anesthesia Quick Evaluation

## 2019-11-30 NOTE — Interval H&P Note (Signed)
History and Physical Interval Note:  11/30/2019 9:10 AM  Kerrie Buffalo  has presented today for surgery, with the diagnosis of bladder tumor.  The various methods of treatment have been discussed with the patient and family. After consideration of risks, benefits and other options for treatment, the patient has consented to  Procedure(s): TRANSURETHRAL RESECTION OF BLADDER TUMOR WITH Gemcitabine (N/A) CYSTOSCOPY WITH RETROGRADE PYELOGRAM (Bilateral) as a surgical intervention.  The patient's history has been reviewed, patient examined, no change in status, stable for surgery.  I have reviewed the patient's chart and labs.  Questions were answered to the patient's satisfaction.    RRR CTAB   Hollice Espy

## 2019-11-30 NOTE — Anesthesia Postprocedure Evaluation (Signed)
Anesthesia Post Note  Patient: Jonathan Small  Procedure(s) Performed: TRANSURETHRAL RESECTION OF BLADDER TUMOR WITH Gemcitabine (N/A ) CYSTOSCOPY WITH RETROGRADE PYELOGRAM (Bilateral )  Patient location during evaluation: PACU Anesthesia Type: General Level of consciousness: awake and alert Pain management: pain level controlled Vital Signs Assessment: post-procedure vital signs reviewed and stable Respiratory status: spontaneous breathing, nonlabored ventilation, respiratory function stable and patient connected to nasal cannula oxygen Cardiovascular status: blood pressure returned to baseline and stable Postop Assessment: no apparent nausea or vomiting Anesthetic complications: no   No complications documented.   Last Vitals:  Vitals:   11/30/19 1034 11/30/19 1045  BP: (!) 165/98 (!) 180/94  Pulse: 74 65  Resp: 10 (!) 9  Temp:    SpO2: 100% 99%    Last Pain:  Vitals:   11/30/19 1047  TempSrc:   PainSc: [facility] Annalea Alguire

## 2019-11-30 NOTE — OR Nursing (Signed)
Large amt of blood from penis (saturated sanitary pad) when standing up to get dressed.  Notified MD via tele to OR 10, will have her come to postop to see patient after current surgery completed.

## 2019-12-01 ENCOUNTER — Telehealth: Payer: Self-pay | Admitting: Radiology

## 2019-12-01 ENCOUNTER — Telehealth: Payer: Self-pay | Admitting: Urology

## 2019-12-01 ENCOUNTER — Ambulatory Visit (INDEPENDENT_AMBULATORY_CARE_PROVIDER_SITE_OTHER): Payer: BC Managed Care – PPO

## 2019-12-01 ENCOUNTER — Emergency Department
Admission: EM | Admit: 2019-12-01 | Discharge: 2019-12-01 | Disposition: A | Discharge status: Home / Self Care | Payer: BC Managed Care – PPO | Source: Home / Self Care

## 2019-12-01 DIAGNOSIS — R310 Gross hematuria: Secondary | ICD-10-CM

## 2019-12-01 DIAGNOSIS — I10 Essential (primary) hypertension: Secondary | ICD-10-CM | POA: Diagnosis not present

## 2019-12-01 LAB — BLADDER SCAN AMB NON-IMAGING

## 2019-12-01 LAB — SURGICAL PATHOLOGY

## 2019-12-01 NOTE — Telephone Encounter (Signed)
Call patient to discuss the surgical pathology results, high-grade noninvasive bladder cancer.  He is doing better.  He was in the ED waiting room overnight with ongoing hematuria but this is subsided.  He was seen earlier this morning in clinic by her CMA, voiding independently with relatively clear urine with a low PVR.  I recommended a follow-up cystoscopy in 3 months.  We discussed the option of BCG however given the relatively small size of the tumor, as well as national shortage, hold off for now and consider this he has recurrence.  He is agreeable this plan.  Hollice Espy, MD

## 2019-12-01 NOTE — Telephone Encounter (Signed)
Patient reports going to the emergency room last night due to brief being saturated with blood post surgery with Dr Erlene Quan yesterday. States he has not been evaluated and asks to be seen in the office instead. Please advise.

## 2019-12-01 NOTE — Progress Notes (Signed)
Patient present post op stating he is having gross hematuria and went to the ER last night because he felt he was unable to empty bladder. Patient was in ER all night with bladder scan done but no foley placed. Patient states he was waiting on a doctor to see him but was directed to come to our office this AM for further evaluation. Patient was able to void in the office this morning and states mostly yellow urine little blood now. PVR done 60ml noted. Patient was instructed to stay well hydrated and call if he is unable to urinate, but to expect blood and some clots since he is post TURBT

## 2019-12-07 ENCOUNTER — Telehealth: Payer: Self-pay | Admitting: Radiology

## 2019-12-07 NOTE — Telephone Encounter (Signed)
Yes, please wait until all soreness and bleeding has stopped before rigorous exercise.  Hollice Espy, MD

## 2019-12-07 NOTE — Telephone Encounter (Signed)
Patient notified and voiced understanding.

## 2019-12-07 NOTE — Telephone Encounter (Signed)
Patient asks when he can go to the gym post op. Reports having to strain occasionally which causes discomfort. Please return call to [phone].

## 2019-12-14 ENCOUNTER — Ambulatory Visit
Admission: RE | Admit: 2019-12-14 | Discharge: 2019-12-14 | Disposition: A | Discharge status: Home / Self Care | Payer: BC Managed Care – PPO | Source: Ambulatory Visit | Attending: Urology | Admitting: Urology

## 2019-12-14 ENCOUNTER — Other Ambulatory Visit: Payer: Self-pay

## 2019-12-14 DIAGNOSIS — N401 Enlarged prostate with lower urinary tract symptoms: Secondary | ICD-10-CM

## 2019-12-14 DIAGNOSIS — N400 Enlarged prostate without lower urinary tract symptoms: Secondary | ICD-10-CM | POA: Diagnosis not present

## 2019-12-14 DIAGNOSIS — R3911 Hesitancy of micturition: Secondary | ICD-10-CM | POA: Insufficient documentation

## 2020-01-11 DIAGNOSIS — E782 Mixed hyperlipidemia: Secondary | ICD-10-CM | POA: Diagnosis not present

## 2020-01-11 DIAGNOSIS — Z0000 Encounter for general adult medical examination without abnormal findings: Secondary | ICD-10-CM | POA: Diagnosis not present

## 2020-01-11 DIAGNOSIS — N5201 Erectile dysfunction due to arterial insufficiency: Secondary | ICD-10-CM | POA: Diagnosis not present

## 2020-01-11 DIAGNOSIS — B009 Herpesviral infection, unspecified: Secondary | ICD-10-CM | POA: Diagnosis not present

## 2020-01-11 DIAGNOSIS — E0590 Thyrotoxicosis, unspecified without thyrotoxic crisis or storm: Secondary | ICD-10-CM | POA: Diagnosis not present

## 2020-01-11 DIAGNOSIS — I10 Essential (primary) hypertension: Secondary | ICD-10-CM | POA: Diagnosis not present

## 2020-02-28 ENCOUNTER — Encounter: Payer: Self-pay | Admitting: Urology

## 2020-02-28 ENCOUNTER — Ambulatory Visit (INDEPENDENT_AMBULATORY_CARE_PROVIDER_SITE_OTHER): Payer: BC Managed Care – PPO | Admitting: Urology

## 2020-02-28 ENCOUNTER — Other Ambulatory Visit: Payer: Self-pay

## 2020-02-28 VITALS — BP 136/56 | HR 79

## 2020-02-28 DIAGNOSIS — N4340 Spermatocele of epididymis, unspecified: Secondary | ICD-10-CM

## 2020-02-28 DIAGNOSIS — Z8551 Personal history of malignant neoplasm of bladder: Secondary | ICD-10-CM

## 2020-02-28 NOTE — Progress Notes (Signed)
° °  02/28/20  CC:  Chief Complaint  Patient presents with   Cysto    HPI: 59 year old male who presents today for surveillance cystoscopy.  He underwent TURBT on 9/21 after cystoscopy revealed incidental bladder tumor upon work-up for irritative voiding symptoms.  Surgical pathology consistent with high grade noninvasive TCC.  Upper tract imaging unremarkable.  Elected not to pursue BCG given the very small tumor size as well as national back order of BCG.  He reports no urinary symptoms today.  His irritative voiding symptoms are improved.  He does mention today that he has a mass in his scrotum his primary care wanted [place] to evaluate.  Blood pressure (!) 136/56, pulse 79. NED. A&Ox3.   No respiratory distress   Abd soft, NT, ND Normal phallus with bilateral descended testicles.  Bilateral epididymal masses identified, approximate 1 cm each consistent with cyst or spermatocele.  Cystoscopy Procedure Note  Patient identification was confirmed, informed consent was obtained, and patient was prepped using Betadine solution.  Lidocaine jelly was administered per urethral meatus.     Pre-Procedure: - Inspection reveals a normal caliber ureteral meatus.  Procedure: The flexible cystoscope was introduced without difficulty - No urethral strictures/lesions are present. - Normal prostate  - Normal bladder neck - Bilateral ureteral orifices identified - Bladder mucosa  reveals no ulcers, tumors, or lesions.  Stellate scar on right lateral bladder wall - No bladder stones - No trabeculation  Retroflexion unremarkable   Post-Procedure: - Patient tolerated the procedure well  Assessment/ Plan:  1. History of bladder cancer No evidence of recurrence Plan for surveillance cystoscopy every 6 months for 2 years and then annually thereafter - Urinalysis, Complete   2. Spermatocele Bilateral spermatoceles versus epididymal cyst on exam today Asymptomatic No intervention  recommended   F/u cysto in 6 months  Hollice Espy, MD

## 2020-02-29 LAB — URINALYSIS, COMPLETE
Bilirubin, UA: NEGATIVE
Glucose, UA: NEGATIVE
Ketones, UA: NEGATIVE
Leukocytes,UA: NEGATIVE
Nitrite, UA: NEGATIVE
Protein,UA: NEGATIVE
RBC, UA: NEGATIVE
Specific Gravity, UA: 1.01 (ref 1.005–1.030)
Urobilinogen, Ur: 0.2 mg/dL (ref 0.2–1.0)
pH, UA: 6.5 (ref 5.0–7.5)

## 2020-02-29 LAB — MICROSCOPIC EXAMINATION
Bacteria, UA: NONE SEEN
Epithelial Cells (non renal): NONE SEEN /hpf (ref 0–10)
RBC, Urine: NONE SEEN /hpf (ref 0–2)

## 2020-04-26 DIAGNOSIS — X32XXXA Exposure to sunlight, initial encounter: Secondary | ICD-10-CM | POA: Diagnosis not present

## 2020-04-26 DIAGNOSIS — D2261 Melanocytic nevi of right upper limb, including shoulder: Secondary | ICD-10-CM | POA: Diagnosis not present

## 2020-04-26 DIAGNOSIS — D2262 Melanocytic nevi of left upper limb, including shoulder: Secondary | ICD-10-CM | POA: Diagnosis not present

## 2020-04-26 DIAGNOSIS — L821 Other seborrheic keratosis: Secondary | ICD-10-CM | POA: Diagnosis not present

## 2020-04-26 DIAGNOSIS — D225 Melanocytic nevi of trunk: Secondary | ICD-10-CM | POA: Diagnosis not present

## 2020-04-26 DIAGNOSIS — L570 Actinic keratosis: Secondary | ICD-10-CM | POA: Diagnosis not present

## 2020-07-15 DIAGNOSIS — N289 Disorder of kidney and ureter, unspecified: Secondary | ICD-10-CM | POA: Diagnosis not present

## 2020-07-15 DIAGNOSIS — E0590 Thyrotoxicosis, unspecified without thyrotoxic crisis or storm: Secondary | ICD-10-CM | POA: Diagnosis not present

## 2020-08-20 DIAGNOSIS — S83241A Other tear of medial meniscus, current injury, right knee, initial encounter: Secondary | ICD-10-CM | POA: Diagnosis not present

## 2020-08-28 ENCOUNTER — Ambulatory Visit: Payer: BC Managed Care – PPO | Admitting: Urology

## 2020-08-28 ENCOUNTER — Other Ambulatory Visit: Payer: Self-pay

## 2020-08-28 VITALS — BP 166/101 | HR 74 | Ht 68.0 in | Wt 180.0 lb

## 2020-08-28 DIAGNOSIS — Z8551 Personal history of malignant neoplasm of bladder: Secondary | ICD-10-CM | POA: Diagnosis not present

## 2020-08-28 NOTE — Progress Notes (Signed)
   08/28/20  CC:  Chief Complaint  Patient presents with   Cysto    HPI: 60 year old male who presents today for surveillance cystoscopy.  He underwent TURBT on 9/21 after cystoscopy revealed incidental bladder tumor upon work-up for irritative voiding symptoms.  Surgical pathology consistent with high grade noninvasive TCC.  Upper tract imaging unremarkable.  Elected not to pursue BCG given the very small tumor size as well as national back order of BCG.  He reports no urinary symptoms today.  His irritative voiding symptoms are improved after cutting back on beer particularly.   Blood pressure (!) 136/56, pulse 79. NED. A&Ox3.   No respiratory distress   Abd soft, NT, ND Normal phallus with bilateral descended testicles.  Bilateral epididymal masses identified, approximate 1 cm each consistent with cyst or spermatocele.  Cystoscopy Procedure Note  Patient identification was confirmed, informed consent was obtained, and patient was prepped using Betadine solution.  Lidocaine jelly was administered per urethral meatus.     Pre-Procedure: - Inspection reveals a normal caliber ureteral meatus.  Procedure: The flexible cystoscope was introduced without difficulty - No urethral strictures/lesions are present. - Normal prostate  - Normal bladder neck - Bilateral ureteral orifices identified - Bladder mucosa  reveals no ulcers, tumors, or lesions.  Stellate scar on right lateral bladder wall with very subtle area of red stippling just inferior and to the left ?  Early recurrence versus inflammatory - No bladder stones - No trabeculation  Retroflexion unremarkable   Post-Procedure: - Patient tolerated the procedure well  Assessment/ Plan:  1. History of bladder cancer Mild changes adjacent to the stellate scar, question early recurrence versus inflammation  We discussed proceeding to the OR for biopsy versus continue to follow this area, albeit somewhat sooner than  scheduled to reassess  We have agreed to reassess in 3 months.  If this area persists or worsens, this may be amenable to an office fulguration, he is worried about cost - Urinalysis, Complete   F/u cysto in 3 months  Hollice Espy, MD

## 2020-08-30 LAB — URINALYSIS, COMPLETE
Bilirubin, UA: NEGATIVE
Glucose, UA: NEGATIVE
Ketones, UA: NEGATIVE
Leukocytes,UA: NEGATIVE
Nitrite, UA: NEGATIVE
Protein,UA: NEGATIVE
RBC, UA: NEGATIVE
Specific Gravity, UA: 1.02 (ref 1.005–1.030)
Urobilinogen, Ur: 0.2 mg/dL (ref 0.2–1.0)
pH, UA: 6.5 (ref 5.0–7.5)

## 2020-08-30 LAB — MICROSCOPIC EXAMINATION: Bacteria, UA: NONE SEEN

## 2020-10-21 DIAGNOSIS — N2889 Other specified disorders of kidney and ureter: Secondary | ICD-10-CM | POA: Diagnosis not present

## 2020-11-01 DIAGNOSIS — D485 Neoplasm of uncertain behavior of skin: Secondary | ICD-10-CM | POA: Diagnosis not present

## 2020-11-01 DIAGNOSIS — D2272 Melanocytic nevi of left lower limb, including hip: Secondary | ICD-10-CM | POA: Diagnosis not present

## 2020-11-01 DIAGNOSIS — D225 Melanocytic nevi of trunk: Secondary | ICD-10-CM | POA: Diagnosis not present

## 2020-11-01 DIAGNOSIS — D2262 Melanocytic nevi of left upper limb, including shoulder: Secondary | ICD-10-CM | POA: Diagnosis not present

## 2020-11-01 DIAGNOSIS — D2261 Melanocytic nevi of right upper limb, including shoulder: Secondary | ICD-10-CM | POA: Diagnosis not present

## 2020-12-02 NOTE — Progress Notes (Signed)
   12/03/2020  CC:  Chief Complaint  Patient presents with   Cysto    HPI: Jonathan Small is a 60 y.o. male with a personal history of bladder cancer, who presents today for a surveillance cystoscopy.   He is s/p TURBT on 9/21 after cystoscopy which revealed incidental bladder tumor.   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 Upper tract imaging showed unremarkable.   He reports no urinary symptoms today.     Vitals:   12/03/20 1044  BP: (!) 142/86  Pulse: 78  NED. A&Ox3.   No respiratory distress   Abd soft, NT, ND Normal phallus with bilateral descended testicles  Cystoscopy Procedure Note  Patient identification was confirmed, informed consent was obtained, and patient was prepped using Betadine solution.  Lidocaine jelly was administered per urethral meatus.     Pre-Procedure: - Inspection reveals a normal caliber ureteral meatus.  Procedure: The flexible cystoscope was introduced without difficulty - No urethral strictures/lesions are present. -  prostate  - Normal bladder neck - Bilateral ureteral orifices identified - Bladder mucosa  reveals no ulcers, tumors, or lesions - Bladder mucosa  reveals no ulcers, tumors, or lesions. Stellate scar on right lateral bladder wall. There were fine papillary changes at the 5 o'clock and 3 o'clock concerning for a satellite tumors, < 5 mm each.  - No bladder stones - No trabeculation  Retroflexion shows fine papillary changes at the 5 o'clock and 3 o'clock concerning for a satellite region.    Post-Procedure: - Patient tolerated the procedure well  Assessment/ Plan:  History of bladder cancer - Fine papillary changes seen on cystoscopy today concerning for satellite recurrence - recommend returning to the operating room for TURBT (small), bilateral retrograde pyelogram, instillation of intravesical gemcitabine -Risk and benefits reviewed including risk of bleeding, infection, damage  surrounding structures amongst others. - will likely recommend BCG induction course if this is  documented recurrence.    Return for Will call to schedule procedure.  I,Kailey Littlejohn,acting as a Education administrator for Hollice Espy, MD.,have documented all relevant documentation on the behalf of Hollice Espy, MD,as directed by  Hollice Espy, MD while in the presence of Hollice Espy, MD.  I have reviewed the above documentation for accuracy and completeness, and I agree with the above.   Hollice Espy, MD

## 2020-12-02 NOTE — H&P (View-Only) (Signed)
   12/03/2020  CC:  Chief Complaint  Patient presents with   Cysto    HPI: Jonathan Small is a 60 y.o. male with a personal history of bladder cancer, who presents today for a surveillance cystoscopy.   He is s/p TURBT on 9/21 after cystoscopy which revealed incidental bladder tumor.   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 Upper tract imaging showed unremarkable.   He reports no urinary symptoms today.     Vitals:   12/03/20 1044  BP: (!) 142/86  Pulse: 78  NED. A&Ox3.   No respiratory distress   Abd soft, NT, ND Normal phallus with bilateral descended testicles  Cystoscopy Procedure Note  Patient identification was confirmed, informed consent was obtained, and patient was prepped using Betadine solution.  Lidocaine jelly was administered per urethral meatus.     Pre-Procedure: - Inspection reveals a normal caliber ureteral meatus.  Procedure: The flexible cystoscope was introduced without difficulty - No urethral strictures/lesions are present. -  prostate  - Normal bladder neck - Bilateral ureteral orifices identified - Bladder mucosa  reveals no ulcers, tumors, or lesions - Bladder mucosa  reveals no ulcers, tumors, or lesions. Stellate scar on right lateral bladder wall. There were fine papillary changes at the 5 o'clock and 3 o'clock concerning for a satellite tumors, < 5 mm each.  - No bladder stones - No trabeculation  Retroflexion shows fine papillary changes at the 5 o'clock and 3 o'clock concerning for a satellite region.    Post-Procedure: - Patient tolerated the procedure well  Assessment/ Plan:  History of bladder cancer - Fine papillary changes seen on cystoscopy today concerning for satellite recurrence - recommend returning to the operating room for TURBT (small), bilateral retrograde pyelogram, instillation of intravesical gemcitabine -Risk and benefits reviewed including risk of bleeding, infection, damage  surrounding structures amongst others. - will likely recommend BCG induction course if this is  documented recurrence.    Return for Will call to schedule procedure.  I,Kailey Littlejohn,acting as a Education administrator for Hollice Espy, MD.,have documented all relevant documentation on the behalf of Hollice Espy, MD,as directed by  Hollice Espy, MD while in the presence of Hollice Espy, MD.  I have reviewed the above documentation for accuracy and completeness, and I agree with the above.   Hollice Espy, MD

## 2020-12-03 ENCOUNTER — Other Ambulatory Visit: Payer: Self-pay

## 2020-12-03 ENCOUNTER — Other Ambulatory Visit: Payer: Self-pay | Admitting: Urology

## 2020-12-03 ENCOUNTER — Encounter: Payer: Self-pay | Admitting: Urology

## 2020-12-03 ENCOUNTER — Ambulatory Visit: Payer: BC Managed Care – PPO | Admitting: Urology

## 2020-12-03 ENCOUNTER — Telehealth: Payer: Self-pay | Admitting: Urology

## 2020-12-03 VITALS — BP 142/86 | HR 78 | Ht 68.0 in | Wt 180.0 lb

## 2020-12-03 DIAGNOSIS — D494 Neoplasm of unspecified behavior of bladder: Secondary | ICD-10-CM

## 2020-12-03 DIAGNOSIS — Z8551 Personal history of malignant neoplasm of bladder: Secondary | ICD-10-CM

## 2020-12-03 NOTE — Progress Notes (Signed)
Surgical Physician Order Form  ** Scheduling expectation : Next Available  *Length of Case:   *Clearance needed: no  *Anticoagulation Instructions: Hold all anticoagulants  *Aspirin Instructions: Ok to continue Aspirin  *Post-op visit Date/Instructions:   TBD  *Diagnosis: Bladder Tumor  *Procedure:  TURBT, small, bilateral retrograde pyelogram, instillation of intravesical gemcitabine  -Admit type: OUTpatient  -Anesthesia: General  -VTE Prophylaxis Standing Order SCD's       Other:   -Standing Lab Orders Per Anesthesia    Lab other: UA/ UCx  -Standing Test orders EKG/Chest x-ray per Anesthesia       Test other:   - Medications:      Gemcitabine 2000 mg, Ancef 2 g   Other Instructions: Patient will be changing jobs in a month, hopes to have procedure done prior to this transition

## 2020-12-03 NOTE — Telephone Encounter (Signed)
Per Dr. Erlene Quan Patient is to be scheduled for Transurethral resection of bladder tumor,bilateral retrograde pylegram,with Gemcitabine instillation.  Jonathan Small, was contacted and possible surgical dates were discussed, 12/30/20 was agreed upon for surgery.     Patient was directed to call [phone] between 1-3pm the day before surgery to find out surgical arrival time.  Instructions were given not to eat or drink from midnight on the night before surgery and have a driver for the day of surgery. On the surgery day patient was instructed to enter through the [place] entrance of [facility] report the Same Day Surgery desk.   Pre-Admit Testing will be in contact via phone to set up an interview with the anesthesia team to review your history and medications prior to surgery.   Reminder of this information was sent via mychart to the patient.   Patient is to hold anticoag's per Dr. Erlene Quan May continue ASA.

## 2020-12-03 NOTE — Addendum Note (Signed)
Addended by: Verlene Mayer A on: 12/03/2020 03:26 PM   Modules accepted: Orders

## 2020-12-04 LAB — MICROSCOPIC EXAMINATION
Bacteria, UA: NONE SEEN
RBC, Urine: NONE SEEN /hpf (ref 0–2)

## 2020-12-04 LAB — URINALYSIS, COMPLETE
Bilirubin, UA: NEGATIVE
Glucose, UA: NEGATIVE
Ketones, UA: NEGATIVE
Leukocytes,UA: NEGATIVE
Nitrite, UA: NEGATIVE
Protein,UA: NEGATIVE
RBC, UA: NEGATIVE
Specific Gravity, UA: 1.025 (ref 1.005–1.030)
Urobilinogen, Ur: 0.2 mg/dL (ref 0.2–1.0)
pH, UA: 6 (ref 5.0–7.5)

## 2020-12-04 MED ORDER — GEMCITABINE CHEMO FOR BLADDER INSTILLATION 2000 MG: 2000.0000 mg | Freq: Once | INTRAVENOUS | Status: DC

## 2020-12-04 NOTE — Progress Notes (Signed)
[facility] Posting Form   Surgery Date/Time: Date: 12/30/2020  Surgeon: Dr. Hollice Espy, MD  Surgery Location: Day Surgery  Inpt ( No  )   Outpt (Yes)   Obs ( No  )   Diagnosis: D49.4 Bladder Tumor  -CPT: 65537    Surgery: TURBT  -CPT: 48270   Surgery: Cystoscopy with retrograde pyelogram  -CPT: 51720   Surgery: Bladder instillation of Gemcitabine  Stop Anticoagulations: No; may continue Aspirin  Cardiac/Medical/Pulmonary Clearance needed: No Clearance Needed  *Orders entered into EPIC  Date: 12/04/2020    *Case booked in [place]  Date: 12/03/2020  *Notified pt of Surgery: Date: 12/03/2020  PRE-OP UA & CX: Yes, Obtained in clinic  *Placed into Prior Authorization Work Que Date: 12/04/20   Assistant/laser/rep:No

## 2020-12-04 NOTE — Telephone Encounter (Signed)
Orders placed in Epic and faxed to Pre-Admit Testing.

## 2020-12-06 LAB — CULTURE, URINE COMPREHENSIVE

## 2020-12-13 DIAGNOSIS — D225 Melanocytic nevi of trunk: Secondary | ICD-10-CM | POA: Diagnosis not present

## 2020-12-20 ENCOUNTER — Other Ambulatory Visit
Admission: RE | Admit: 2020-12-20 | Discharge: 2020-12-20 | Disposition: A | Discharge status: Home / Self Care | Payer: BC Managed Care – PPO | Source: Ambulatory Visit | Attending: Urology | Admitting: Urology

## 2020-12-20 ENCOUNTER — Other Ambulatory Visit: Payer: Self-pay

## 2020-12-20 HISTORY — DX: Unspecified osteoarthritis, unspecified site: M19.90

## 2020-12-20 NOTE — Patient Instructions (Addendum)
Your procedure is scheduled on: 12/30/20 - Monday Report to the Registration Desk on the 1st floor of the [facility]. To find out your arrival time, please call [phone] between 1PM - 3PM on: 12/27/20 - Friday Report to [facility] for Labs on 12/24/20 at 8:00 am.  REMEMBER: Instructions that are not followed completely may result in serious medical risk, up to and including death; or upon the discretion of your surgeon and anesthesiologist your surgery may need to be rescheduled.  Do not eat food or drink any fluids after midnight the night before surgery.  No gum chewing, lozengers or hard candies.   TAKE THESE MEDICATIONS THE MORNING OF SURGERY WITH A SIP OF WATER: - methimazole (TAPAZOLE) 5 MG tablet - simvastatin (ZOCOR) 40 MG tablet  Follow recommendations from Cardiologist, Pulmonologist or PCP regarding stopping Aspirin, Coumadin, Plavix, Eliquis, Pradaxa, or Pletal. Per patient, Aspirin has been stopped.  One week prior to surgery: Stop Anti-inflammatories (NSAIDS) such as Advil, Aleve, Ibuprofen, Motrin, Naproxen, Naprosyn and Aspirin based products such as Excedrin, Goodys Powder, BC Powder.  Stop ANY OVER THE COUNTER supplements until after surgery.  You may however, continue to take Tylenol if needed for pain up until the day of surgery.  No Alcohol for 24 hours before or after surgery.  No Smoking including e-cigarettes for 24 hours prior to surgery.  No chewable tobacco products for at least 6 hours prior to surgery.  No nicotine patches on the day of surgery.  Do not use any "recreational" drugs for at least a week prior to your surgery.  Please be advised that the combination of cocaine and anesthesia may have negative outcomes, up to and including death. If you test positive for cocaine, your surgery will be cancelled.  On the morning of surgery brush your teeth with toothpaste and water, you may rinse your mouth with mouthwash if you wish. Do not  swallow any toothpaste or mouthwash.  Do not wear jewelry, make-up, hairpins, clips or nail polish.  Do not wear lotions, powders, or perfumes.   Do not shave body from the neck down 48 hours prior to surgery just in case you cut yourself which could leave a site for infection.  Also, freshly shaved skin may become irritated if using the CHG soap.  Contact lenses, hearing aids and dentures may not be worn into surgery.  Do not bring valuables to the hospital. [facility] is not responsible for any missing/lost belongings or valuables.   Notify your doctor if there is any change in your medical condition (cold, fever, infection).  Wear comfortable clothing (specific to your surgery type) to the hospital.  After surgery, you can help prevent lung complications by doing breathing exercises.  Take deep breaths and cough every 1-2 hours. Your doctor may order a device called an Incentive Spirometer to help you take deep breaths. When coughing or sneezing, hold a pillow firmly against your incision with both hands. This is called "splinting." Doing this helps protect your incision. It also decreases belly discomfort.  If you are being admitted to the hospital overnight, leave your suitcase in the car. After surgery it may be brought to your room.  If you are being discharged the day of surgery, you will not be allowed to drive home. You will need a responsible adult (18 years or older) to drive you home and stay with you that night.   If you are taking public transportation, you will need to have a  responsible adult (18 years or older) with you. Please confirm with your physician that it is acceptable to use public transportation.   Please call the [facility]. at [phone] if you have any questions about these instructions.  Surgery Visitation Policy:  Patients undergoing a surgery or procedure may have one family member or support person with them as long as that  person is not COVID-19 positive or experiencing its symptoms.  That person may remain in the waiting area during the procedure and may rotate out with other people.  Inpatient Visitation:    Visiting hours are 7 a.m. to 8 p.m. Up to two visitors ages 16+ are allowed at one time in a patient room. The visitors may rotate out with other people during the day. Visitors must check out when they leave, or other visitors will not be allowed. One designated support person may remain overnight. The visitor must pass COVID-19 screenings, use hand sanitizer when entering and exiting the patient's room and wear a mask at all times, including in the patient's room. Patients must also wear a mask when staff or their visitor are in the room. Masking is required regardless of vaccination status.

## 2020-12-24 ENCOUNTER — Other Ambulatory Visit
Admission: RE | Admit: 2020-12-24 | Discharge: 2020-12-24 | Disposition: A | Discharge status: Home / Self Care | Payer: BC Managed Care – PPO | Source: Ambulatory Visit | Attending: Urology | Admitting: Urology

## 2020-12-24 ENCOUNTER — Other Ambulatory Visit: Payer: Self-pay

## 2020-12-24 DIAGNOSIS — Z01818 Encounter for other preprocedural examination: Secondary | ICD-10-CM | POA: Diagnosis not present

## 2020-12-24 DIAGNOSIS — Z01810 Encounter for preprocedural cardiovascular examination: Secondary | ICD-10-CM | POA: Diagnosis not present

## 2020-12-24 LAB — CBC
HCT: 45.9 % (ref 39.0–52.0)
Hemoglobin: 15.7 g/dL (ref 13.0–17.0)
MCH: 31.9 pg (ref 26.0–34.0)
MCHC: 34.2 g/dL (ref 30.0–36.0)
MCV: 93.3 fL (ref 80.0–100.0)
Platelets: 345 10*3/uL (ref 150–400)
RBC: 4.92 MIL/uL (ref 4.22–5.81)
RDW: 12.3 % (ref 11.5–15.5)
WBC: 8.7 10*3/uL (ref 4.0–10.5)
nRBC: 0 % (ref 0.0–0.2)

## 2020-12-24 LAB — BASIC METABOLIC PANEL
Anion gap: 8 (ref 5–15)
BUN: 21 mg/dL — ABNORMAL HIGH (ref 6–20)
CO2: 28 mmol/L (ref 22–32)
Calcium: 9.1 mg/dL (ref 8.9–10.3)
Chloride: 101 mmol/L (ref 98–111)
Creatinine, Ser: 1.47 mg/dL — ABNORMAL HIGH (ref 0.61–1.24)
GFR, Estimated: 55 mL/min — ABNORMAL LOW (ref >60)
Glucose, Bld: 90 mg/dL (ref 70–99)
Potassium: 4.2 mmol/L (ref 3.5–5.1)
Sodium: 137 mmol/L (ref 135–145)

## 2020-12-29 MED ORDER — CHLORHEXIDINE GLUCONATE 0.12 % MT SOLN: 15.0000 mL | Freq: Once | OROMUCOSAL | Status: AC

## 2020-12-29 MED ORDER — FAMOTIDINE 20 MG PO TABS: 20.0000 mg | ORAL_TABLET | Freq: Once | ORAL | Status: AC

## 2020-12-29 MED ORDER — CEFAZOLIN SODIUM-DEXTROSE 2-4 GM/100ML-% IV SOLN
2.0000 g | INTRAVENOUS | Status: AC
  Administered 2020-12-30: 2 g via INTRAVENOUS

## 2020-12-29 MED ORDER — APREPITANT 40 MG PO CAPS: 40.0000 mg | ORAL_CAPSULE | Freq: Once | ORAL | Status: AC

## 2020-12-29 MED ORDER — ORAL CARE MOUTH RINSE: 15.0000 mL | Freq: Once | OROMUCOSAL | Status: AC

## 2020-12-29 MED ORDER — LACTATED RINGERS IV SOLN: INTRAVENOUS | Status: DC

## 2020-12-30 ENCOUNTER — Ambulatory Visit: Payer: BC Managed Care – PPO | Admitting: Certified Registered Nurse Anesthetist

## 2020-12-30 ENCOUNTER — Ambulatory Visit: Payer: BC Managed Care – PPO

## 2020-12-30 ENCOUNTER — Encounter
Admission: RE | Disposition: A | Discharge status: Home / Self Care | Payer: Self-pay | Source: Ambulatory Visit | Attending: Urology

## 2020-12-30 ENCOUNTER — Other Ambulatory Visit: Payer: Self-pay

## 2020-12-30 ENCOUNTER — Ambulatory Visit
Admission: RE | Admit: 2020-12-30 | Discharge: 2020-12-30 | Disposition: A | Discharge status: Home / Self Care | Payer: BC Managed Care – PPO | Source: Ambulatory Visit | Attending: Urology | Admitting: Urology

## 2020-12-30 ENCOUNTER — Encounter: Payer: Self-pay | Admitting: Urology

## 2020-12-30 DIAGNOSIS — D494 Neoplasm of unspecified behavior of bladder: Secondary | ICD-10-CM

## 2020-12-30 DIAGNOSIS — C679 Malignant neoplasm of bladder, unspecified: Secondary | ICD-10-CM | POA: Diagnosis not present

## 2020-12-30 DIAGNOSIS — Z87891 Personal history of nicotine dependence: Secondary | ICD-10-CM | POA: Diagnosis not present

## 2020-12-30 DIAGNOSIS — D090 Carcinoma in situ of bladder: Secondary | ICD-10-CM | POA: Insufficient documentation

## 2020-12-30 DIAGNOSIS — E7800 Pure hypercholesterolemia, unspecified: Secondary | ICD-10-CM | POA: Diagnosis not present

## 2020-12-30 DIAGNOSIS — C672 Malignant neoplasm of lateral wall of bladder: Secondary | ICD-10-CM | POA: Diagnosis not present

## 2020-12-30 HISTORY — PX: CYSTOSCOPY W/ RETROGRADES: SHX1426

## 2020-12-30 HISTORY — PX: TRANSURETHRAL RESECTION OF BLADDER TUMOR: SHX2575

## 2020-12-30 SURGERY — TURBT (TRANSURETHRAL RESECTION OF BLADDER TUMOR)
Anesthesia: General | Site: Bladder

## 2020-12-30 MED ORDER — OXYCODONE HCL 5 MG/5ML PO SOLN
5.0000 mg | Freq: Once | ORAL | Status: AC | PRN
Start: 2020-12-30 — End: 2020-12-30

## 2020-12-30 MED ORDER — LIDOCAINE HCL (CARDIAC) PF 100 MG/5ML IV SOSY
PREFILLED_SYRINGE | INTRAVENOUS | Status: DC | PRN
  Administered 2020-12-30: 80 mg via INTRAVENOUS

## 2020-12-30 MED ORDER — ACETAMINOPHEN 10 MG/ML IV SOLN
INTRAVENOUS | Status: DC | PRN
  Administered 2020-12-30: 1000 mg via INTRAVENOUS

## 2020-12-30 MED ORDER — MEPERIDINE HCL 25 MG/ML IJ SOLN: 6.2500 mg | INTRAMUSCULAR | Status: DC | PRN

## 2020-12-30 MED ORDER — PROPOFOL 10 MG/ML IV BOLUS
INTRAVENOUS | Status: DC | PRN
  Administered 2020-12-30: 160 mg via INTRAVENOUS
  Administered 2020-12-30 (×2): 40 mg via INTRAVENOUS

## 2020-12-30 MED ORDER — PROPOFOL 500 MG/50ML IV EMUL
INTRAVENOUS | Status: DC | PRN
Start: 2020-12-30 — End: 2020-12-30
  Administered 2020-12-30: 150 ug/kg/min via INTRAVENOUS

## 2020-12-30 MED ORDER — MIDAZOLAM HCL 2 MG/2ML IJ SOLN
INTRAMUSCULAR | Status: DC | PRN
  Administered 2020-12-30: 2 mg via INTRAVENOUS

## 2020-12-30 MED ORDER — OXYCODONE HCL 5 MG PO TABS
ORAL_TABLET | ORAL | Status: AC
  Filled 2020-12-30: qty 1

## 2020-12-30 MED ORDER — FENTANYL CITRATE (PF) 100 MCG/2ML IJ SOLN
INTRAMUSCULAR | Status: AC
  Filled 2020-12-30: qty 2

## 2020-12-30 MED ORDER — IOHEXOL 180 MG/ML  SOLN
INTRAMUSCULAR | Status: DC | PRN
  Administered 2020-12-30: 15 mL

## 2020-12-30 MED ORDER — LACTATED RINGERS IV SOLN: INTRAVENOUS | Status: DC | PRN

## 2020-12-30 MED ORDER — SODIUM CHLORIDE 0.9 % IR SOLN
Status: DC | PRN
  Administered 2020-12-30: 2000 mL

## 2020-12-30 MED ORDER — MIDAZOLAM HCL 2 MG/2ML IJ SOLN
INTRAMUSCULAR | Status: AC
  Filled 2020-12-30: qty 2

## 2020-12-30 MED ORDER — FENTANYL CITRATE (PF) 100 MCG/2ML IJ SOLN: 25.0000 ug | INTRAMUSCULAR | Status: DC | PRN

## 2020-12-30 MED ORDER — DEXAMETHASONE SODIUM PHOSPHATE 10 MG/ML IJ SOLN
INTRAMUSCULAR | Status: DC | PRN
  Administered 2020-12-30: 10 mg via INTRAVENOUS

## 2020-12-30 MED ORDER — BELLADONNA ALKALOIDS-OPIUM 16.2-60 MG RE SUPP
RECTAL | Status: AC
  Filled 2020-12-30: qty 1

## 2020-12-30 MED ORDER — PROPOFOL 500 MG/50ML IV EMUL
INTRAVENOUS | Status: AC
  Filled 2020-12-30: qty 50

## 2020-12-30 MED ORDER — PROMETHAZINE HCL 25 MG/ML IJ SOLN: 6.2500 mg | INTRAMUSCULAR | Status: DC | PRN

## 2020-12-30 MED ORDER — OXYCODONE HCL 5 MG PO TABS
5.0000 mg | ORAL_TABLET | Freq: Once | ORAL | Status: AC | PRN
  Administered 2020-12-30: 5 mg via ORAL

## 2020-12-30 MED ORDER — CEFAZOLIN SODIUM-DEXTROSE 2-4 GM/100ML-% IV SOLN
INTRAVENOUS | Status: AC
  Filled 2020-12-30: qty 100

## 2020-12-30 MED ORDER — ACETAMINOPHEN 10 MG/ML IV SOLN
INTRAVENOUS | Status: AC
  Filled 2020-12-30: qty 100

## 2020-12-30 MED ORDER — PROPOFOL 10 MG/ML IV BOLUS
INTRAVENOUS | Status: AC
  Filled 2020-12-30: qty 20

## 2020-12-30 MED ORDER — BELLADONNA ALKALOIDS-OPIUM 16.2-60 MG RE SUPP
RECTAL | Status: DC | PRN
  Administered 2020-12-30: 1 via RECTAL

## 2020-12-30 MED ORDER — ONDANSETRON HCL 4 MG/2ML IJ SOLN
INTRAMUSCULAR | Status: DC | PRN
  Administered 2020-12-30: 4 mg via INTRAVENOUS

## 2020-12-30 MED ORDER — OXYBUTYNIN CHLORIDE 5 MG PO TABS: 5.0000 mg | ORAL_TABLET | Freq: Three times a day (TID) | ORAL | 0 refills | Status: DC | PRN

## 2020-12-30 MED ORDER — APREPITANT 40 MG PO CAPS
ORAL_CAPSULE | ORAL | Status: AC
  Administered 2020-12-30: 40 mg via ORAL
  Filled 2020-12-30: qty 1

## 2020-12-30 MED ORDER — CHLORHEXIDINE GLUCONATE 0.12 % MT SOLN
OROMUCOSAL | Status: AC
  Administered 2020-12-30: 15 mL via OROMUCOSAL
  Filled 2020-12-30: qty 15

## 2020-12-30 MED ORDER — GEMCITABINE CHEMO FOR BLADDER INSTILLATION 2000 MG
INTRAVENOUS | Status: DC | PRN
  Administered 2020-12-30: 2000 mg via INTRAVESICAL

## 2020-12-30 MED ORDER — HYDROCODONE-ACETAMINOPHEN 5-325 MG PO TABS: 1.0000 | ORAL_TABLET | Freq: Four times a day (QID) | ORAL | 0 refills | Status: DC | PRN

## 2020-12-30 MED ORDER — FAMOTIDINE 20 MG PO TABS
ORAL_TABLET | ORAL | Status: AC
  Administered 2020-12-30: 20 mg via ORAL
  Filled 2020-12-30: qty 1

## 2020-12-30 SURGICAL SUPPLY — 37 items
BAG DRAIN CYSTO-URO LG1000N (MISCELLANEOUS) ×3 IMPLANT
BAG DRN RND TRDRP ANRFLXCHMBR (UROLOGICAL SUPPLIES) ×2
BAG URINE DRAIN 2000ML AR STRL (UROLOGICAL SUPPLIES) ×3 IMPLANT
BRUSH SCRUB EZ  4% CHG (MISCELLANEOUS) ×3
BRUSH SCRUB EZ 1% IODOPHOR (MISCELLANEOUS) ×3 IMPLANT
BRUSH SCRUB EZ 4% CHG (MISCELLANEOUS) ×2 IMPLANT
CATH FOL 2WAY LX 16X30 (CATHETERS) ×1 IMPLANT
CATH FOLEY 2WAY  5CC 16FR (CATHETERS)
CATH FOLEY 2WAY 5CC 16FR (CATHETERS)
CATH URETL OPEN 5X70 (CATHETERS) ×3 IMPLANT
CATH URTH 16FR FL 2W BLN LF (CATHETERS) ×2 IMPLANT
DRAPE UTILITY 15X26 TOWEL STRL (DRAPES) ×3 IMPLANT
DRSG TELFA 4X3 1S NADH ST (GAUZE/BANDAGES/DRESSINGS) ×3 IMPLANT
ELECT LOOP 22F BIPOLAR SML (ELECTROSURGICAL)
ELECT REM PT RETURN 9FT ADLT (ELECTROSURGICAL)
ELECTRODE LOOP 22F BIPOLAR SML (ELECTROSURGICAL) IMPLANT
ELECTRODE REM PT RTRN 9FT ADLT (ELECTROSURGICAL) IMPLANT
GAUZE 4X4 16PLY ~~LOC~~+RFID DBL (SPONGE) ×6 IMPLANT
GLOVE SURG ENC MOIS LTX SZ6.5 (GLOVE) ×3 IMPLANT
GOWN STRL REUS W/ TWL LRG LVL3 (GOWN DISPOSABLE) ×4 IMPLANT
GOWN STRL REUS W/TWL LRG LVL3 (GOWN DISPOSABLE) ×6
GUIDEWIRE STR DUAL SENSOR (WIRE) ×3 IMPLANT
IV NS IRRIG 3000ML ARTHROMATIC (IV SOLUTION) ×3 IMPLANT
KIT TURNOVER CYSTO (KITS) ×3 IMPLANT
LOOP CUT BIPOLAR 24F LRG (ELECTROSURGICAL) IMPLANT
NDL SAFETY ECLIPSE 18X1.5 (NEEDLE) ×2 IMPLANT
NEEDLE HYPO 18GX1.5 SHARP (NEEDLE) ×3
PACK CYSTO AR (MISCELLANEOUS) ×3 IMPLANT
PAD ARMBOARD 7.5X6 YLW CONV (MISCELLANEOUS) ×3 IMPLANT
SET CYSTO W/LG BORE CLAMP LF (SET/KITS/TRAYS/PACK) ×3 IMPLANT
SET IRRIG Y TYPE TUR BLADDER L (SET/KITS/TRAYS/PACK) ×3 IMPLANT
SURGILUBE 2OZ TUBE FLIPTOP (MISCELLANEOUS) ×3 IMPLANT
SYR TOOMEY IRRIG 70ML (MISCELLANEOUS) ×3
SYRINGE TOOMEY IRRIG 70ML (MISCELLANEOUS) ×2 IMPLANT
WATER STERILE IRR 1000ML POUR (IV SOLUTION) ×3 IMPLANT
WATER STERILE IRR 3000ML UROMA (IV SOLUTION) IMPLANT
WATER STERILE IRR 500ML POUR (IV SOLUTION) ×3 IMPLANT

## 2020-12-30 NOTE — Transfer of Care (Signed)
Immediate Anesthesia Transfer of Care Note  Patient: Jonathan Small  Procedure(s) Performed: TRANSURETHRAL RESECTION OF BLADDER TUMOR (TURBT) WITH GEMCITABINE (Bladder) CYSTOSCOPY WITH RETROGRADE PYELOGRAM (Bilateral: Bladder)  Patient Location: PACU  Anesthesia Type:General  Level of Consciousness: awake, alert  and oriented  Airway & Oxygen Therapy: Patient Spontanous Breathing and Patient connected to face mask oxygen  Post-op Assessment: Report given to RN and Post -op Vital signs reviewed and stable  Post vital signs: Reviewed and stable  Last Vitals:  Vitals Value Taken Time  BP 141/97 12/30/20 1024  Temp    Pulse 83 12/30/20 1026  Resp 19 12/30/20 1026  SpO2 100 % 12/30/20 1026  Vitals shown include unvalidated device data.  Last Pain:  Vitals:   12/30/20 0854  TempSrc: Oral  PainSc: 0-No pain         Complications: No notable events documented.

## 2020-12-30 NOTE — Discharge Instructions (Addendum)

## 2020-12-30 NOTE — Op Note (Signed)
Date of procedure: 12/30/20  Preoperative diagnosis:  History of bladder cancer Bladder tumor, right lateral wall Bladder erythema  Postoperative diagnosis:  Same as above  Procedure: TURBT, small less than 2 cm Bladder biopsy Bilateral retrograde pyelogram Instillation of intravesical chemotherapy (gemcitabine)  Surgeon: Hollice Espy, MD  Anesthesia: General  Complications: None  Intraoperative findings: Carpeting-like papillary tumor just above the right UO at the 7 o'clock position of his previous TURBT stellate scar.  This measured approximately 1 cm and did not involve the right UO but in close proximity.  There is also some erythema at the 1 o'clock position of a stellate scar concerning for possible underlying CIS which was biopsied.  No other bladder bladder pathology identified.  EBL: Minimal  Specimens: Bladder tumor right lateral wall, bladder erythema  Drains: 20 French Foley catheter  Indication: Jonathan Small is a 60 y.o. patient with personal history of bladder cancer found to have probable early recurrence as well as some adjacent erythema concerning for possible CIS.  After reviewing the management options for treatment, he elected to proceed with the above surgical procedure(s). We have discussed the potential benefits and risks of the procedure, side effects of the proposed treatment, the likelihood of the patient achieving the goals of the procedure, and any potential problems that might occur during the procedure or recuperation. Informed consent has been obtained.  Description of procedure:  The patient was taken to the operating room and general anesthesia was induced.  The patient was placed in the dorsal lithotomy position, prepped and draped in the usual sterile fashion, and preoperative antibiotics were administered. A preoperative time-out was performed.   A 21 French cystoscope was advanced per urethra into the bladder.  The bladder was carefully  inspected.  There is approximately a 1 cm lesion which appeared to be broad-based, papillary nature situated just above the right UO on the right lateral wall.  Just above this, there was a large stellate scar and this appeared to be at approximately the 7 o'clock position of the stellate scar.  There is also some surrounding erythema.  The UO appeared to be separate from this.  In addition to this, there was an area of erythema at the 1:00 periphery of the stellate scar which was nonspecific.  Attention was turned to the right ureteral orifice with a 5 [place] open-ended ureteral catheter.  Gentle retrograde pyelogram on this side revealed no hydroureteronephrosis or filling defects.  The same procedure was performed on the left which is also unremarkable.  Next, cold cup biopsy forceps were used to carefully resect the entirety of the abnormal papillary area.  Care was taken to avoid any resection of the UO itself.  Bovie electrocautery was then used to fulgurate in and around this area.  A separate biopsy was performed of the bladder erythema x2.  This was sent as a separate specimen.  Once adequate hemostasis was achieved, I observed the right UO to ensure there is adequate E flux.  This was not appreciated.  I did one last retrograde pyelogram on the side which showed prompt drainage and no extravasation or any injury to the UO.  Patency was confirmed.  At the end of the case, a 27 French Foley catheter was placed.  The balloon was filled with 10 cc of sterile water.  The patient was then cleaned and dried, repositioned in supine position, reversed myesthesia, and taken to PACU in stable condition.  Notably, did administer BNO suppository to help him  tolerate intravesical gemcitabine.  Plan: In the PACU, 2000 mg of intravesical gemcitabine was allowed to dwell.  This stain his bladder for an hour which was well-tolerated.  After an hour, this was allowed to drain.  Will call him with his pathology  results and schedule additional follow-up if additional treatment is warranted.  Otherwise, we will schedule him in the office for cystoscopy in 3 months.  Follow-up for 41-month cystoscopy, will call with pathology results and any other follow-up instructions.  Hollice Espy, M.D.

## 2020-12-30 NOTE — Progress Notes (Signed)
Dr. Randa Lynn isa okay to discharge patient with HR in 34s

## 2020-12-30 NOTE — Interval H&P Note (Signed)
History and Physical Interval Note:  12/30/2020 9:25 AM  Jonathan Small  has presented today for surgery, with the diagnosis of bladder tumor.  The various methods of treatment have been discussed with the patient and family. After consideration of risks, benefits and other options for treatment, the patient has consented to  Procedure(s): TRANSURETHRAL RESECTION OF BLADDER TUMOR (TURBT) WITH GEMCITABINE (N/A) CYSTOSCOPY WITH RETROGRADE PYELOGRAM (Bilateral) as a surgical intervention.  The patient's history has been reviewed, patient examined, no change in status, stable for surgery.  I have reviewed the patient's chart and labs.  Questions were answered to the patient's satisfaction.    RRR CTAB   Hollice Espy

## 2020-12-30 NOTE — Anesthesia Preprocedure Evaluation (Signed)
Anesthesia Evaluation  Patient identified by MRN, date of birth, ID band Patient awake    Reviewed: Allergy & Precautions, NPO status , Patient's Chart, lab work & pertinent test results  History of Anesthesia Complications (+) PONV and history of anesthetic complications  Airway Mallampati: II  TM Distance: >3 FB Neck ROM: Full    Dental  (+) Poor Dentition,    Pulmonary neg sleep apnea, neg COPD, former smoker,    breath sounds clear to auscultation- rhonchi (-) wheezing      Cardiovascular hypertension, Pt. 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     Comment:  Brain MRI-BENIGN No date: Arthritis No date: Cancer ([employer]) No date: Complication of anesthesia 06/08/2013: Dizziness and giddiness No date: Family history of adverse reaction to anesthesia     Comment:  sister-hard to wake up No date: GERD (gastroesophageal reflux disease) No date: Hypercholesteremia No date: Hypertension No date: Hyperthyroidism 06/08/2013: Localization-related (focal) (partial) epilepsy and  epileptic syndromes with complex partial seizures, without mention of  intractable epilepsy No date: Polyp of colon No date: PONV (postoperative nausea and vomiting)     Comment:  with 1st colonoscopy only   Reproductive/Obstetrics                             Anesthesia Physical Anesthesia Plan  ASA: 3  Anesthesia Plan: General   Post-op Pain  Management:    Induction: Intravenous  PONV Risk Score and Plan: 2 and Ondansetron, Dexamethasone, TIVA, Midazolam and Aprepitant  Airway Management Planned: LMA  Additional Equipment:   Intra-op Plan:   Post-operative Plan:   Informed Consent: I have reviewed the patients History and Physical, chart, labs and discussed the procedure including the risks, benefits and alternatives for the proposed anesthesia with the patient or authorized representative who has indicated his/her understanding and acceptance.     Dental advisory given  Plan Discussed with: CRNA and Anesthesiologist  Anesthesia Plan Comments:         Anesthesia Quick Evaluation

## 2020-12-30 NOTE — Anesthesia Procedure Notes (Signed)
Procedure Name: LMA Insertion Date/Time: 12/30/2020 9:51 AM Performed by: Willette Alma, CRNA Pre-anesthesia Checklist: Patient identified, Patient being monitored, Timeout performed, Emergency Drugs available and Suction available Patient Re-evaluated:Patient Re-evaluated prior to induction Oxygen Delivery Method: Circle system utilized Preoxygenation: Pre-oxygenation with 100% oxygen Induction Type: IV induction Ventilation: Mask ventilation without difficulty LMA: LMA inserted LMA Size: 5.0 Tube type: Oral Number of attempts: 1 Placement Confirmation: positive ETCO2 and breath sounds checked- equal and bilateral Tube secured with: Tape Dental Injury: Teeth and Oropharynx as per pre-operative assessment

## 2020-12-30 NOTE — Anesthesia Postprocedure Evaluation (Signed)
Anesthesia Post Note  Patient: Jonathan Small  Procedure(s) Performed: TRANSURETHRAL RESECTION OF BLADDER TUMOR (TURBT) WITH GEMCITABINE (Bladder) CYSTOSCOPY WITH RETROGRADE PYELOGRAM (Bilateral: Bladder)  Patient location during evaluation: PACU Anesthesia Type: General Level of consciousness: awake and alert and oriented Pain management: pain level controlled Vital Signs Assessment: post-procedure vital signs reviewed and stable Respiratory status: spontaneous breathing, nonlabored ventilation and respiratory function stable Cardiovascular status: blood pressure returned to baseline and stable Postop Assessment: no signs of nausea or vomiting Anesthetic complications: no   No notable events documented.   Last Vitals:  Vitals:   12/30/20 1130 12/30/20 1140  BP: (!) 163/85 (!) 155/79  Pulse: (!) 49 (!) 52  Resp: 16 (!) 9  Temp:  (!) 36.2 C  SpO2: 98% 99%    Last Pain:  Vitals:   12/30/20 1140  TempSrc:   PainSc: 5                  Myeesha Shane

## 2020-12-31 ENCOUNTER — Encounter: Payer: Self-pay | Admitting: Urology

## 2020-12-31 LAB — SURGICAL PATHOLOGY

## 2021-01-10 ENCOUNTER — Other Ambulatory Visit: Payer: Self-pay

## 2021-01-10 ENCOUNTER — Telehealth (INDEPENDENT_AMBULATORY_CARE_PROVIDER_SITE_OTHER): Payer: BC Managed Care – PPO | Admitting: Urology

## 2021-01-10 DIAGNOSIS — C672 Malignant neoplasm of lateral wall of bladder: Secondary | ICD-10-CM

## 2021-01-10 NOTE — Patient Instructions (Signed)

## 2021-01-10 NOTE — Progress Notes (Signed)
This service is provided via telemedicine   No vital signs collected/recorded due to the encounter was a telemedicine visit.     Patient consents to a telephone visit:  yes    Names of all persons participating in the telemedicine service and their role in the encounter:  Fonnie Jarvis, CMA, Hollice Espy, MD

## 2021-01-10 NOTE — Progress Notes (Signed)
Virtual Visit via Video Note  I connected with Jonathan Small on 01/10/21 at  9:30 AM EDT by a video enabled telemedicine application and verified that I am speaking with the correct person using two identifiers.  Location: Patient: work Provider: [facility]   I discussed the limitations of evaluation and management by telemedicine and the availability of in person appointments. The patient expressed understanding and agreed to proceed.  History of Present Illness: 60 year old male with a personal tree of bladder cancer returns today via virtual visit to discuss his most recent pathology findings.  He is s/p TURBT on 9/21 after cystoscopy which revealed incidental bladder tumor.    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 Upper tract imaging showed unremarkable.   On surveillance cystoscopy most recently, he was found to have recurrent bladder tumor at the edge of his previous resection site as well as an adjacent area of erythema.  He was taken to the operating room on 12/30/2020 for TURBT, bilateral retrograde and intravesical gemcitabine.  Surgical pathology is consistent with high-grade noninvasive TCC as well as the area of erythema which was consistent with CIS.  No postoperative issues.   Observations/Objective: Appears well today,  Assessment and Plan:  1.  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  I recommended induction course of BCG.  We discussed the overall response rate which is excellent, 70%.  We discussed possible side effects including risk of infection, irritative urinary symptoms, BCG sepsis amongst others.  All questions were answered.  He is agreeable for induction BCG x6.  We will plan on cystoscopy in 3 months as well along with urine cytology.  All questions were answered.  Follow Up Instructions:    I discussed the assessment and  treatment plan with the patient. The patient was provided an opportunity to ask questions and all were answered. The patient agreed with the plan and demonstrated an understanding of the instructions.   The patient was advised to call back or seek an in-person evaluation if the symptoms worsen or if the condition fails to improve as anticipated.  I provided 15 minutes of non-face-to-face time during this encounter.   Hollice Espy, MD

## 2021-01-14 ENCOUNTER — Telehealth: Payer: Self-pay

## 2021-01-14 NOTE — Telephone Encounter (Signed)
Per Dr. Erlene Quan patient will need to start induction BCG around 11/28.

## 2021-01-15 NOTE — Telephone Encounter (Signed)
Spoke with patient and reviewed in detail BCG induction treatment education. Patient was scheduled for 6 treatments and a 3 mo cysto following his last treatment. Patient verbalized understanding. Education previously sent by Dr. Erlene Quan via my chart for review

## 2021-01-20 DIAGNOSIS — E782 Mixed hyperlipidemia: Secondary | ICD-10-CM | POA: Diagnosis not present

## 2021-01-20 DIAGNOSIS — B009 Herpesviral infection, unspecified: Secondary | ICD-10-CM | POA: Diagnosis not present

## 2021-01-20 DIAGNOSIS — Z23 Encounter for immunization: Secondary | ICD-10-CM | POA: Diagnosis not present

## 2021-01-20 DIAGNOSIS — Z125 Encounter for screening for malignant neoplasm of prostate: Secondary | ICD-10-CM | POA: Diagnosis not present

## 2021-01-20 DIAGNOSIS — Z0000 Encounter for general adult medical examination without abnormal findings: Secondary | ICD-10-CM | POA: Diagnosis not present

## 2021-01-20 DIAGNOSIS — I10 Essential (primary) hypertension: Secondary | ICD-10-CM | POA: Diagnosis not present

## 2021-01-20 DIAGNOSIS — E0590 Thyrotoxicosis, unspecified without thyrotoxic crisis or storm: Secondary | ICD-10-CM | POA: Diagnosis not present

## 2021-02-11 ENCOUNTER — Ambulatory Visit (INDEPENDENT_AMBULATORY_CARE_PROVIDER_SITE_OTHER): Payer: BC Managed Care – PPO | Admitting: Physician Assistant

## 2021-02-11 ENCOUNTER — Other Ambulatory Visit: Payer: Self-pay

## 2021-02-11 DIAGNOSIS — C672 Malignant neoplasm of lateral wall of bladder: Secondary | ICD-10-CM | POA: Diagnosis not present

## 2021-02-11 LAB — URINALYSIS, COMPLETE
Bilirubin, UA: NEGATIVE
Glucose, UA: NEGATIVE
Ketones, UA: NEGATIVE
Leukocytes,UA: NEGATIVE
Nitrite, UA: NEGATIVE
Protein,UA: NEGATIVE
RBC, UA: NEGATIVE
Specific Gravity, UA: 1.015 (ref 1.005–1.030)
Urobilinogen, Ur: 0.2 mg/dL (ref 0.2–1.0)
pH, UA: 6.5 (ref 5.0–7.5)

## 2021-02-11 LAB — MICROSCOPIC EXAMINATION: Bacteria, UA: NONE SEEN

## 2021-02-11 MED ORDER — BCG LIVE 50 MG IS SUSR
3.2400 mL | Freq: Once | INTRAVESICAL | Status: AC
  Administered 2021-02-11: 81 mg via INTRAVESICAL

## 2021-02-11 NOTE — Progress Notes (Signed)
BCG Bladder Instillation  BCG # 1 of 6  Due to Bladder Cancer patient is present today for a BCG treatment. Patient was cleaned and prepped in a sterile fashion with betadine. A 14FR catheter was inserted, urine return was noted 23ml, urine was yellow in color.  102ml of reconstituted BCG was instilled into the bladder. The catheter was then removed. Patient tolerated well, no complications were noted  Performed by: Debroah Loop, PA-C and Bradly Bienenstock, CMA  Additional notes: Patient remained in clinic for 30 minutes following instillation today for monitoring of hypersensitivity reaction; none noted.  We reviewed post-instillation instructions including holding the urine for 2 hours with quarter turns every 15 minutes and pouring bleach into the toilet with subsequent voids for 6 hours. Written instructions also provided today. He expressed understanding.   Follow up: 1 week for BCG #2 of 6

## 2021-02-11 NOTE — Patient Instructions (Signed)
Right now through 6:25pm: Hold your urine and do your quarter turns every 15 minutes. 6:25pm-11:25pm today: Every time you urinate, pour 1/2 cup of bleach into the toilet and let it sit for 15 minutes prior to flushing. 11:25pm onward: Resume your normal routine.   Patient Education: (BCG) Into the Bladder (Intravesical Chemotherapy)  BCG is a vaccine which is used to prevent tuberculosis (TB).  But it's also a helpful treatment for some early bladder cancers.  When BCG goes directly into the bladder the treatment is described as intravesical.  BCG is a type of immunotherapy.  Immunotherapy stimulates the body's immune system to destroy cancer cells.  How it's given BCG treatment is given to you in an outpatient setting.  It takes a few minutes to administer and you can go home as soon as it's finished.  It might be a good idea to ask someone to bring you, particularly the fist time.  Unlike chemotherapy into the bladder, BCG treatment is never given immediately after surgery to remove bladder tumors.  There needs to be a delay usually of at least two weeks after surgery, before you can have it.  You won't be given treatment with BCG if you are unwell or have an infection in your urine.  You're usually asked to limit the amount you drink before your treatment.  This will help to increase the concentration of BCG in your bladder.  Drinking too much before your treatment may make your bladder feel uncomfortably full.  If you normally take water tablets (diuretics) take them later in the day after your treatment.  Your nurse or doctor will give you more advise about preparing for your treatment.  You will have a small tube (catheter) placed into your bladder.  Your doctor will then put the liquid vaccine directly into your bladder through the catheter and remove the catheter.  You will need to hold your urine for two hours afterwards.  This can be difficult but it's to give the treatment time to work.   You can walk around during this time.  When the treatment is over you can go to the toilet.  After your treatment there are some precautions you'll need to take.  This is because BCG is a live vaccine and other people shouldn't be exposed to it.  For the next six hours, you'll need to avoid your urine splashing on the toilet seat and getting any urine on your hands.  It might be easer for men to sit down when they're using an ordinary toilet although using a stand up urinal should be alright.  The main this is to avoid splashing urine and spreading the vaccine.  You will also be asked to put 1/2 cup undiluted bleach into the toilet to destroy any live vaccine and leave it for 15 minutes until you flush.  Side Effects Because BCG goes directly into the bladder most of the side effects are linked with the bladder.  They usually go away within one to two days after your treatment.  The most common ones are: -needing to pass urine often -pain when you pass urine -blood in urine -flu-like symptoms (tiredness, general aching and raised temperature)  Theses side effects should settle down within a day or two.  If they don't get better contact your doctor.  Drinking lots of fluids can help flush the drug out of your bladder and reduce some of these effects.  Taking Ibuprofen or Aleve is encouraged unless you have a  condition that would make these medications unsafe to take (renal failure, diabetes, gerd)  Rare side effects can include a continuing high temperature (fever), pain in your joints and a cough.  If you have any of these symptoms, or if you feel generally unwell, contact your doctor.  These symptoms could be a sign of a more serious infection (due to BCG) that needs to be treated immediately.  If this happens you'll be treated with the same drugs (antibiotics) that are used to treat TB.  Contraception Men should use a condom during sex for the first 48 hours after their treatment.  If you are a  women who has had BCG treatment then your partner should use a condom.  Using a condom will protect your partner from any vaccine present in your semen or vaginal fluid.  We don't know how BCG may affect a developing fetus so it's not advisable to become pregnant or father a child while having it.  It is important to use effective contraception during your treatment and for six weeks afterwards.  You can discuss this with your doctor or specialist nurse.

## 2021-02-18 ENCOUNTER — Ambulatory Visit: Payer: BC Managed Care – PPO | Admitting: Physician Assistant

## 2021-02-18 ENCOUNTER — Other Ambulatory Visit: Payer: Self-pay

## 2021-02-18 DIAGNOSIS — C672 Malignant neoplasm of lateral wall of bladder: Secondary | ICD-10-CM | POA: Diagnosis not present

## 2021-02-18 LAB — MICROSCOPIC EXAMINATION
Bacteria, UA: NONE SEEN
Epithelial Cells (non renal): NONE SEEN /hpf (ref 0–10)
RBC, Urine: NONE SEEN /hpf (ref 0–2)

## 2021-02-18 LAB — URINALYSIS, COMPLETE
Bilirubin, UA: NEGATIVE
Glucose, UA: NEGATIVE
Ketones, UA: NEGATIVE
Leukocytes,UA: NEGATIVE
Nitrite, UA: NEGATIVE
Protein,UA: NEGATIVE
RBC, UA: NEGATIVE
Specific Gravity, UA: 1.02 (ref 1.005–1.030)
Urobilinogen, Ur: 0.2 mg/dL (ref 0.2–1.0)
pH, UA: 6.5 (ref 5.0–7.5)

## 2021-02-18 MED ORDER — BCG LIVE 50 MG IS SUSR
3.2400 mL | Freq: Once | INTRAVESICAL | Status: AC
  Administered 2021-02-18: 81 mg via INTRAVESICAL

## 2021-02-18 NOTE — Progress Notes (Signed)
BCG Bladder Instillation  BCG # 2 of 6  Due to Bladder Cancer patient is present today for a BCG treatment. Patient was cleaned and prepped in a sterile fashion with betadine. A 14FR catheter was inserted, urine return was noted 1ml.  8ml of reconstituted BCG was instilled into the bladder. The catheter was then removed. Patient tolerated well, no complications were noted  Performed by: Debroah Loop, PA-C and Bradly Bienenstock, CMA  Follow up/ Additional notes: 1 week for BCG #3 of 6

## 2021-02-25 ENCOUNTER — Ambulatory Visit (INDEPENDENT_AMBULATORY_CARE_PROVIDER_SITE_OTHER): Payer: BC Managed Care – PPO | Admitting: Physician Assistant

## 2021-02-25 ENCOUNTER — Other Ambulatory Visit: Payer: Self-pay

## 2021-02-25 DIAGNOSIS — C672 Malignant neoplasm of lateral wall of bladder: Secondary | ICD-10-CM | POA: Diagnosis not present

## 2021-02-25 LAB — URINALYSIS, COMPLETE
Bilirubin, UA: NEGATIVE
Glucose, UA: NEGATIVE
Ketones, UA: NEGATIVE
Leukocytes,UA: NEGATIVE
Nitrite, UA: NEGATIVE
Protein,UA: NEGATIVE
RBC, UA: NEGATIVE
Specific Gravity, UA: 1.01 (ref 1.005–1.030)
Urobilinogen, Ur: 0.2 mg/dL (ref 0.2–1.0)
pH, UA: 6.5 (ref 5.0–7.5)

## 2021-02-25 LAB — MICROSCOPIC EXAMINATION
Bacteria, UA: NONE SEEN
Epithelial Cells (non renal): NONE SEEN /hpf (ref 0–10)
RBC, Urine: NONE SEEN /hpf (ref 0–2)
WBC, UA: NONE SEEN /hpf (ref 0–5)

## 2021-02-25 MED ORDER — BCG LIVE 50 MG IS SUSR
3.2400 mL | Freq: Once | INTRAVESICAL | Status: AC
  Administered 2021-02-25: 81 mg via INTRAVESICAL

## 2021-02-25 NOTE — Progress Notes (Signed)
BCG Bladder Instillation  BCG # 3 of 6  Due to Bladder Cancer patient is present today for a BCG treatment. Patient was cleaned and prepped in a sterile fashion with betadine. A 14FR catheter was inserted, urine return was noted 58ml, urine was yellow in color.  38ml of reconstituted BCG was instilled into the bladder. The catheter was then removed. Patient tolerated well, no complications were noted  Performed by: Debroah Loop, PA-C and Bradly Bienenstock, CMA  Follow up/ Additional notes: 1 week for BCG #4 of 6

## 2021-03-04 ENCOUNTER — Ambulatory Visit: Payer: BC Managed Care – PPO | Admitting: Physician Assistant

## 2021-03-05 DIAGNOSIS — U071 COVID-19: Secondary | ICD-10-CM | POA: Diagnosis not present

## 2021-03-11 ENCOUNTER — Other Ambulatory Visit: Payer: Self-pay

## 2021-03-11 ENCOUNTER — Ambulatory Visit (INDEPENDENT_AMBULATORY_CARE_PROVIDER_SITE_OTHER): Payer: BC Managed Care – PPO | Admitting: Physician Assistant

## 2021-03-11 DIAGNOSIS — R310 Gross hematuria: Secondary | ICD-10-CM

## 2021-03-11 DIAGNOSIS — C672 Malignant neoplasm of lateral wall of bladder: Secondary | ICD-10-CM | POA: Diagnosis not present

## 2021-03-11 LAB — MICROSCOPIC EXAMINATION
Bacteria, UA: NONE SEEN
RBC, Urine: NONE SEEN /hpf (ref 0–2)

## 2021-03-11 LAB — URINALYSIS, COMPLETE
Bilirubin, UA: NEGATIVE
Glucose, UA: NEGATIVE
Ketones, UA: NEGATIVE
Leukocytes,UA: NEGATIVE
Nitrite, UA: NEGATIVE
Protein,UA: NEGATIVE
RBC, UA: NEGATIVE
Specific Gravity, UA: 1.015 (ref 1.005–1.030)
Urobilinogen, Ur: 0.2 mg/dL (ref 0.2–1.0)
pH, UA: 6 (ref 5.0–7.5)

## 2021-03-11 MED ORDER — BCG LIVE 50 MG IS SUSR
3.2400 mL | Freq: Once | INTRAVESICAL | Status: AC
  Administered 2021-03-11: 81 mg via INTRAVESICAL

## 2021-03-11 NOTE — Progress Notes (Signed)
BCG Bladder Instillation  BCG # 4 of 6  Due to Bladder Cancer patient is present today for a BCG treatment. Patient was cleaned and prepped in a sterile fashion with betadine. A 14FR catheter was inserted, urine return was noted 64ml, urine was yellow in color.  12ml of reconstituted BCG was instilled into the bladder. The catheter was then removed. Patient tolerated well, no complications were noted  Performed by: Debroah Loop, PA-C and Elberta Leatherwood, CMA  Follow up/ Additional notes: 1 week for BCG #5 of 6

## 2021-03-18 ENCOUNTER — Other Ambulatory Visit: Payer: Self-pay

## 2021-03-18 ENCOUNTER — Ambulatory Visit (INDEPENDENT_AMBULATORY_CARE_PROVIDER_SITE_OTHER): Payer: BC Managed Care – PPO | Admitting: Physician Assistant

## 2021-03-18 DIAGNOSIS — C672 Malignant neoplasm of lateral wall of bladder: Secondary | ICD-10-CM | POA: Diagnosis not present

## 2021-03-18 MED ORDER — BCG LIVE 50 MG IS SUSR
1.0800 mL | Freq: Once | INTRAVESICAL | Status: AC
  Administered 2021-03-18: 27 mg via INTRAVESICAL

## 2021-03-18 NOTE — Progress Notes (Signed)
BCG Bladder Instillation  BCG # 5 of 6  Due to Bladder Cancer patient is present today for a BCG treatment. Patient was cleaned and prepped in a sterile fashion with betadine. A 14FR catheter was inserted, urine return was noted 11ml, urine was yellow in color.  67ml of reconstituted BCG was instilled into the bladder. The catheter was then removed. Patient tolerated well, no complications were noted  Performed by: Debroah Loop, PA-C and Bradly Bienenstock, CMA  Additional notes: Patient received a split, 1/3 dose of BCG today due to BCG shortage. Patient notified and all questions answered.   Follow ups: 1 week for BCG #6 of 6

## 2021-03-19 LAB — MICROSCOPIC EXAMINATION
Bacteria, UA: NONE SEEN
Epithelial Cells (non renal): NONE SEEN /hpf (ref 0–10)
RBC, Urine: NONE SEEN /hpf (ref 0–2)

## 2021-03-19 LAB — URINALYSIS, COMPLETE
Bilirubin, UA: NEGATIVE
Glucose, UA: NEGATIVE
Ketones, UA: NEGATIVE
Leukocytes,UA: NEGATIVE
Nitrite, UA: NEGATIVE
Protein,UA: NEGATIVE
RBC, UA: NEGATIVE
Specific Gravity, UA: 1.015 (ref 1.005–1.030)
Urobilinogen, Ur: 0.2 mg/dL (ref 0.2–1.0)
pH, UA: 7 (ref 5.0–7.5)

## 2021-03-25 ENCOUNTER — Ambulatory Visit (INDEPENDENT_AMBULATORY_CARE_PROVIDER_SITE_OTHER): Payer: BC Managed Care – PPO | Admitting: Family Medicine

## 2021-03-25 ENCOUNTER — Other Ambulatory Visit: Payer: Self-pay

## 2021-03-25 DIAGNOSIS — C672 Malignant neoplasm of lateral wall of bladder: Secondary | ICD-10-CM | POA: Diagnosis not present

## 2021-03-25 MED ORDER — BCG LIVE 50 MG IS SUSR
3.2400 mL | Freq: Once | INTRAVESICAL | Status: AC
Start: 2021-03-25 — End: 2021-03-25
  Administered 2021-03-25: 81 mg via INTRAVESICAL

## 2021-03-25 NOTE — Progress Notes (Signed)
BCG Bladder Instillation  BCG # 6 of 6  Due to Bladder Cancer patient is present today for a BCG treatment. Patient was cleaned and prepped in a sterile fashion with betadine. A 14FR catheter was inserted, urine return was noted 4ml, urine was yellow in color.  11ml of reconstituted BCG was instilled into the bladder. The catheter was then removed. Patient tolerated well, no complications were noted  Performed by: Elberta Leatherwood, CMA, Gaspar Cola, CMA  Follow up/ Additional notes: 3 months for Cysto

## 2021-03-26 LAB — URINALYSIS, COMPLETE
Bilirubin, UA: NEGATIVE
Glucose, UA: NEGATIVE
Ketones, UA: NEGATIVE
Leukocytes,UA: NEGATIVE
Nitrite, UA: NEGATIVE
Protein,UA: NEGATIVE
RBC, UA: NEGATIVE
Specific Gravity, UA: 1.01 (ref 1.005–1.030)
Urobilinogen, Ur: 0.2 mg/dL (ref 0.2–1.0)
pH, UA: 6 (ref 5.0–7.5)

## 2021-03-26 LAB — MICROSCOPIC EXAMINATION
Bacteria, UA: NONE SEEN
Epithelial Cells (non renal): NONE SEEN /hpf (ref 0–10)
RBC, Urine: NONE SEEN /hpf (ref 0–2)

## 2021-04-02 DIAGNOSIS — I10 Essential (primary) hypertension: Secondary | ICD-10-CM | POA: Insufficient documentation

## 2021-04-03 ENCOUNTER — Other Ambulatory Visit: Payer: Self-pay | Admitting: Nephrology

## 2021-04-03 DIAGNOSIS — E0590 Thyrotoxicosis, unspecified without thyrotoxic crisis or storm: Secondary | ICD-10-CM | POA: Insufficient documentation

## 2021-04-03 DIAGNOSIS — E785 Hyperlipidemia, unspecified: Secondary | ICD-10-CM

## 2021-04-03 DIAGNOSIS — Z9889 Other specified postprocedural states: Secondary | ICD-10-CM | POA: Diagnosis not present

## 2021-04-03 DIAGNOSIS — Z8603 Personal history of neoplasm of uncertain behavior: Secondary | ICD-10-CM | POA: Insufficient documentation

## 2021-04-03 DIAGNOSIS — N1831 Chronic kidney disease, stage 3a: Secondary | ICD-10-CM | POA: Diagnosis not present

## 2021-04-03 DIAGNOSIS — N289 Disorder of kidney and ureter, unspecified: Secondary | ICD-10-CM | POA: Insufficient documentation

## 2021-04-03 DIAGNOSIS — I10 Essential (primary) hypertension: Secondary | ICD-10-CM | POA: Diagnosis not present

## 2021-04-10 ENCOUNTER — Ambulatory Visit
Admission: RE | Admit: 2021-04-10 | Discharge: 2021-04-10 | Disposition: A | Discharge status: Home / Self Care | Payer: BC Managed Care – PPO | Source: Ambulatory Visit | Attending: Nephrology | Admitting: Nephrology

## 2021-04-10 ENCOUNTER — Other Ambulatory Visit: Payer: Self-pay

## 2021-04-10 DIAGNOSIS — N1831 Chronic kidney disease, stage 3a: Secondary | ICD-10-CM | POA: Insufficient documentation

## 2021-04-10 DIAGNOSIS — E785 Hyperlipidemia, unspecified: Secondary | ICD-10-CM | POA: Diagnosis not present

## 2021-04-10 DIAGNOSIS — N183 Chronic kidney disease, stage 3 unspecified: Secondary | ICD-10-CM | POA: Diagnosis not present

## 2021-04-23 DIAGNOSIS — S83241D Other tear of medial meniscus, current injury, right knee, subsequent encounter: Secondary | ICD-10-CM | POA: Diagnosis not present

## 2021-05-01 DIAGNOSIS — E0590 Thyrotoxicosis, unspecified without thyrotoxic crisis or storm: Secondary | ICD-10-CM | POA: Diagnosis not present

## 2021-05-01 DIAGNOSIS — E785 Hyperlipidemia, unspecified: Secondary | ICD-10-CM | POA: Diagnosis not present

## 2021-05-01 DIAGNOSIS — Z9889 Other specified postprocedural states: Secondary | ICD-10-CM | POA: Diagnosis not present

## 2021-05-01 DIAGNOSIS — N1831 Chronic kidney disease, stage 3a: Secondary | ICD-10-CM | POA: Diagnosis not present

## 2021-06-09 DIAGNOSIS — H11002 Unspecified pterygium of left eye: Secondary | ICD-10-CM | POA: Diagnosis not present

## 2021-06-09 DIAGNOSIS — Z0100 Encounter for examination of eyes and vision without abnormal findings: Secondary | ICD-10-CM | POA: Diagnosis not present

## 2021-06-16 NOTE — Progress Notes (Signed)
? ?  06/16/21 ?CC: No chief complaint on file. ? ? ? ? ?HPI: ?Jonathan Small is a 61 y.o. male with a personal history of bladder cancer, who presents today for a surveillance cystoscopy.  ? ?He is s/p TURBT on 11/2019 after which revealed incidental bladder tumor. 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 Upper tract imaging showed unremarkable.  ? ?He had a repeat cysto on 12/03/2020 that showed fine papillary changes at the 5 o'clock and 3 o'clock concerning for a satellite tumors, < 5 mm each.  ? ?He returned to the OR for repeat TURBT on 12/30/2020. Intraoperative findings: Carpeting-like papillary tumor just above the right UO at the 7 o'clock position of his previous TURBT stellate scar.  This measured approximately 1 cm and did not involve the right UO but in close proximity.  There is also some erythema at the 1 o'clock position of a stellate scar concerning for possible underlying CIS which was biopsied.  No other bladder bladder pathology identified. ? 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 ? ?He elected to undergo induction BCG x6. He received his last BCG on 03/25/2021. ? ?There were no vitals filed for this visit. ?NED. A&Ox3.   ?No respiratory distress   ?Abd soft, NT, ND ?Normal phallus with bilateral descended testicles ? ?Cystoscopy Procedure Note ? ?Patient identification was confirmed, informed consent was obtained, and patient was prepped using Betadine solution.  Lidocaine jelly was administered per urethral meatus.   ? ? ?Pre-Procedure: ?- Inspection reveals a normal caliber ureteral meatus. ? ?Procedure: ?The flexible cystoscope was introduced without difficulty ?- No urethral strictures/lesions are present. ?- Normal prostate  ?- Normal bladder neck ?- Bilateral ureteral orifices identified ?- Bladder mucosa  reveals no ulcers, tumors, or lesions ?- No bladder stones ?- No  trabeculation ?- very small area of slight erythema near dome suspect this might be related to scope trauma ?Retroflexion shows unremarkable  ? ? ?Post-Procedure: ?- Patient tolerated the procedure well ? ? ?Assessment/ Plan: ?History of bladder cancer  ?- S/p TURBT and BCG induction course x6 ?- Cystoscopy was unremarkable today ?- Will continue surveillance every 6 months  ?  ? ?Return in 6 months for Cystoscopy  ? ?I,Kailey Littlejohn,acting as a Education administrator for Hollice Espy, MD.,have documented all relevant documentation on the behalf of Hollice Espy, MD,as directed by  Hollice Espy, MD while in the presence of Hollice Espy, MD. ? ?I have reviewed the above documentation for accuracy and completeness, and I agree with the above.  ? ?Hollice Espy, MD ? ?

## 2021-06-17 ENCOUNTER — Ambulatory Visit: Payer: BC Managed Care – PPO | Admitting: Urology

## 2021-06-17 VITALS — BP 149/88 | HR 78 | Ht 68.0 in | Wt 180.0 lb

## 2021-06-17 DIAGNOSIS — C672 Malignant neoplasm of lateral wall of bladder: Secondary | ICD-10-CM | POA: Diagnosis not present

## 2021-06-17 LAB — MICROSCOPIC EXAMINATION
Bacteria, UA: NONE SEEN
RBC, Urine: NONE SEEN /hpf (ref 0–2)

## 2021-06-17 LAB — URINALYSIS, COMPLETE
Bilirubin, UA: NEGATIVE
Glucose, UA: NEGATIVE
Ketones, UA: NEGATIVE
Leukocytes,UA: NEGATIVE
Nitrite, UA: NEGATIVE
Protein,UA: NEGATIVE
RBC, UA: NEGATIVE
Specific Gravity, UA: 1.02 (ref 1.005–1.030)
Urobilinogen, Ur: 0.2 mg/dL (ref 0.2–1.0)
pH, UA: 6.5 (ref 5.0–7.5)

## 2021-08-19 DIAGNOSIS — S83241D Other tear of medial meniscus, current injury, right knee, subsequent encounter: Secondary | ICD-10-CM | POA: Diagnosis not present

## 2021-08-30 DIAGNOSIS — M25561 Pain in right knee: Secondary | ICD-10-CM | POA: Diagnosis not present

## 2021-09-02 DIAGNOSIS — S83241D Other tear of medial meniscus, current injury, right knee, subsequent encounter: Secondary | ICD-10-CM | POA: Diagnosis not present

## 2021-10-02 ENCOUNTER — Telehealth: Payer: Self-pay | Admitting: *Deleted

## 2021-10-02 NOTE — Chronic Care Management (AMB) (Signed)
  Care Coordination  Note  10/02/2021 Name: Jonathan Small MRN: [id number] DOB: 1960-03-21  Jonathan Small is a 61 y.o. year old male who is a primary care patient of Lawerance Cruel, MD. I reached out to Jonathan Small by phone today to offer care coordination services.        Follow up plan: Unsuccessful telephone outreach attempt made. A HIPAA compliant phone message was left for the patient providing contact information and requesting a return call.   Julian Hy, [place] Direct Dial: [phone]

## 2021-10-16 DIAGNOSIS — I10 Essential (primary) hypertension: Secondary | ICD-10-CM | POA: Diagnosis not present

## 2021-10-16 DIAGNOSIS — E0590 Thyrotoxicosis, unspecified without thyrotoxic crisis or storm: Secondary | ICD-10-CM | POA: Diagnosis not present

## 2021-10-16 DIAGNOSIS — E785 Hyperlipidemia, unspecified: Secondary | ICD-10-CM | POA: Diagnosis not present

## 2021-10-16 DIAGNOSIS — N1831 Chronic kidney disease, stage 3a: Secondary | ICD-10-CM | POA: Diagnosis not present

## 2021-10-29 DIAGNOSIS — E785 Hyperlipidemia, unspecified: Secondary | ICD-10-CM | POA: Diagnosis not present

## 2021-10-29 DIAGNOSIS — N1831 Chronic kidney disease, stage 3a: Secondary | ICD-10-CM | POA: Diagnosis not present

## 2021-10-29 DIAGNOSIS — E0590 Thyrotoxicosis, unspecified without thyrotoxic crisis or storm: Secondary | ICD-10-CM | POA: Diagnosis not present

## 2021-10-29 DIAGNOSIS — Z9889 Other specified postprocedural states: Secondary | ICD-10-CM | POA: Diagnosis not present

## 2021-10-29 NOTE — Chronic Care Management (AMB) (Signed)
  Care Coordination  Outreach Note  10/29/2021 Name: ANIAS BARTOL MRN: [id number] DOB: 1960-09-21   Care Coordination Outreach Attempts  A second unsuccessful outreach was attempted today to offer the patient with information about available care coordination services as a benefit of their health plan.     Follow Up Plan:  Additional outreach attempts will be made to offer the patient care coordination information and services.   Encounter Outcome:  No Answer  Julian Hy, [place] Direct Dial: [phone]

## 2021-11-03 ENCOUNTER — Telehealth: Payer: Self-pay | Admitting: *Deleted

## 2021-11-03 DIAGNOSIS — B351 Tinea unguium: Secondary | ICD-10-CM | POA: Diagnosis not present

## 2021-11-03 DIAGNOSIS — B354 Tinea corporis: Secondary | ICD-10-CM | POA: Diagnosis not present

## 2021-11-03 DIAGNOSIS — B353 Tinea pedis: Secondary | ICD-10-CM | POA: Diagnosis not present

## 2021-11-03 DIAGNOSIS — D225 Melanocytic nevi of trunk: Secondary | ICD-10-CM | POA: Diagnosis not present

## 2021-11-03 NOTE — Chronic Care Management (AMB) (Signed)
  Care Coordination  Outreach Note  11/03/2021 Name: Jonathan Small MRN: [id number] DOB: Aug 21, 1960   Care Coordination Outreach Attempts  A third unsuccessful outreach was attempted today to offer the patient with information about available care coordination services as a benefit of their health plan.   Follow Up Plan:  No further outreach attempts will be made at this time. We have been unable to contact the patient to offer or enroll patient in care coordination services  Encounter Outcome:  No Answer  Julian Hy, [place] Direct Dial: [phone]

## 2021-11-03 NOTE — Chronic Care Management (AMB) (Signed)
Opened in error

## 2021-11-07 DIAGNOSIS — M1711 Unilateral primary osteoarthritis, right knee: Secondary | ICD-10-CM | POA: Diagnosis not present

## 2021-12-01 DIAGNOSIS — Z01812 Encounter for preprocedural laboratory examination: Secondary | ICD-10-CM | POA: Diagnosis not present

## 2021-12-01 DIAGNOSIS — M1711 Unilateral primary osteoarthritis, right knee: Secondary | ICD-10-CM | POA: Diagnosis not present

## 2021-12-01 DIAGNOSIS — M25561 Pain in right knee: Secondary | ICD-10-CM | POA: Diagnosis not present

## 2021-12-16 ENCOUNTER — Other Ambulatory Visit: Payer: Self-pay | Admitting: Urology

## 2021-12-16 ENCOUNTER — Ambulatory Visit: Payer: BC Managed Care – PPO | Admitting: Urology

## 2021-12-16 ENCOUNTER — Encounter: Payer: Self-pay | Admitting: Urology

## 2021-12-16 VITALS — BP 144/79 | HR 80 | Ht 68.0 in | Wt 180.0 lb

## 2021-12-16 DIAGNOSIS — N3289 Other specified disorders of bladder: Secondary | ICD-10-CM | POA: Diagnosis not present

## 2021-12-16 DIAGNOSIS — R310 Gross hematuria: Secondary | ICD-10-CM | POA: Diagnosis not present

## 2021-12-16 DIAGNOSIS — D494 Neoplasm of unspecified behavior of bladder: Secondary | ICD-10-CM

## 2021-12-16 LAB — MICROSCOPIC EXAMINATION
Bacteria, UA: NONE SEEN
Epithelial Cells (non renal): NONE SEEN /hpf (ref 0–10)

## 2021-12-16 LAB — URINALYSIS, COMPLETE
Bilirubin, UA: NEGATIVE
Glucose, UA: NEGATIVE
Ketones, UA: NEGATIVE
Leukocytes,UA: NEGATIVE
Nitrite, UA: NEGATIVE
Protein,UA: NEGATIVE
RBC, UA: NEGATIVE
Specific Gravity, UA: 1.01 (ref 1.005–1.030)
Urobilinogen, Ur: 0.2 mg/dL (ref 0.2–1.0)
pH, UA: 6 (ref 5.0–7.5)

## 2021-12-16 NOTE — Progress Notes (Unsigned)
Surgical Physician Order Form [facility] [place]  * Scheduling expectation : Next Available  *Length of Case:   *Clearance needed: no  *Anticoagulation Instructions: Hold all anticoagulants  *Aspirin Instructions: Ok to continue Aspirin  *Post-op visit Date/Instructions:   1 week post op (can be virtual)  *Diagnosis: Bladder Tumor  *Procedure: bilateral  RTG, TURBT (small), intravesical BCG   Additional orders: Gemcitabine '2000mg'$  bladder instillation  -Admit type: OUTpatient  -Anesthesia: General  -VTE Prophylaxis Standing Order SCD's       Other:   -Standing Lab Orders Per Anesthesia    Lab other: None  -Standing Test orders EKG/Chest x-ray per Anesthesia       Test other:   - Medications:  Ancef 2gm IV  -Other orders:  N/A

## 2021-12-16 NOTE — H&P (View-Only) (Signed)
   12/16/21 CC:  Chief Complaint  Patient presents with   Cysto     HPI: Jonathan Small is a 61 y.o. male with a personal history of bladder cancer, who presents today for a surveillance cystoscopy.   He is s/p TURBT on 11/2019 after which revealed incidental bladder tumor. 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 Upper tract imaging showed unremarkable.   He had a repeat cysto on 12/03/2020 that showed fine papillary changes at the 5 o'clock and 3 o'clock concerning for a satellite tumors, < 5 mm each.   He returned to the OR for repeat TURBT on 12/30/2020. Intraoperative findings: Carpeting-like papillary tumor just above the right UO at the 7 o'clock position of his previous TURBT stellate scar.  This measured approximately 1 cm and did not involve the right UO but in close proximity.  There is also some erythema at the 1 o'clock position of a stellate scar concerning for possible underlying CIS which was biopsied.  No other bladder bladder pathology identified.  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  He elected to undergo induction BCG x6. He received his last BCG on 03/25/2021.  Vitals:   12/16/21 1535  BP: (!) 144/79  Pulse: 80   NED. A&Ox3.   No respiratory distress   Abd soft, NT, ND Normal phallus with bilateral descended testicles  Cystoscopy Procedure Note  Patient identification was confirmed, informed consent was obtained, and patient was prepped using Betadine solution.  Lidocaine jelly was administered per urethral meatus.     Pre-Procedure: - Inspection reveals a normal caliber ureteral meatus.  Procedure: The flexible cystoscope was introduced without difficulty - No urethral strictures/lesions are present. - Normal prostate  - Normal bladder neck - Bilateral ureteral orifices identified - Bladder mucosa  reveals no ulcers, tumors, or  lesions - No bladder stones - No trabeculation - Patch of erythema with vascularity on left posterior dome, ~1 cm highly concerning for CIS vs. Early recurrence Retroflexion shows unremarkable    Post-Procedure: - Patient tolerated the procedure well   Assessment/ Plan:  Bladder mass/ erythema - concern for CIS vs. Early TCC -Recommend TURBT + bilateral RTG + intravesical gemcitabine -Risks and benefits reviewed again today, risk of bleeding, infection, damage to surrounding structures amoungst others.  All questions answered -urine cytology today -Preop UCx     Hollice Espy, MD

## 2021-12-16 NOTE — Progress Notes (Signed)
   12/16/21 CC:  Chief Complaint  Patient presents with   Cysto     HPI: Jonathan Small is a 61 y.o. male with a personal history of bladder cancer, who presents today for a surveillance cystoscopy.   He is s/p TURBT on 11/2019 after which revealed incidental bladder tumor. 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 Upper tract imaging showed unremarkable.   He had a repeat cysto on 12/03/2020 that showed fine papillary changes at the 5 o'clock and 3 o'clock concerning for a satellite tumors, < 5 mm each.   He returned to the OR for repeat TURBT on 12/30/2020. Intraoperative findings: Carpeting-like papillary tumor just above the right UO at the 7 o'clock position of his previous TURBT stellate scar.  This measured approximately 1 cm and did not involve the right UO but in close proximity.  There is also some erythema at the 1 o'clock position of a stellate scar concerning for possible underlying CIS which was biopsied.  No other bladder bladder pathology identified.  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  He elected to undergo induction BCG x6. He received his last BCG on 03/25/2021.  Vitals:   12/16/21 1535  BP: (!) 144/79  Pulse: 80   NED. A&Ox3.   No respiratory distress   Abd soft, NT, ND Normal phallus with bilateral descended testicles  Cystoscopy Procedure Note  Patient identification was confirmed, informed consent was obtained, and patient was prepped using Betadine solution.  Lidocaine jelly was administered per urethral meatus.     Pre-Procedure: - Inspection reveals a normal caliber ureteral meatus.  Procedure: The flexible cystoscope was introduced without difficulty - No urethral strictures/lesions are present. - Normal prostate  - Normal bladder neck - Bilateral ureteral orifices identified - Bladder mucosa  reveals no ulcers, tumors, or  lesions - No bladder stones - No trabeculation - Patch of erythema with vascularity on left posterior dome, ~1 cm highly concerning for CIS vs. Early recurrence Retroflexion shows unremarkable    Post-Procedure: - Patient tolerated the procedure well   Assessment/ Plan:  Bladder mass/ erythema - concern for CIS vs. Early TCC -Recommend TURBT + bilateral RTG + intravesical gemcitabine -Risks and benefits reviewed again today, risk of bleeding, infection, damage to surrounding structures amoungst others.  All questions answered -urine cytology today -Preop UCx     Hollice Espy, MD

## 2021-12-18 ENCOUNTER — Telehealth: Payer: Self-pay

## 2021-12-18 LAB — CYTOLOGY - NON PAP

## 2021-12-18 NOTE — Progress Notes (Signed)
[facility] Posting Form   Surgery Date/Time: Date: 12/22/2021  Surgeon: Dr. Hollice Espy, MD  Surgery Location: Day Surgery  Inpt ( No  )   Outpt (Yes)   Obs ( No  )   Diagnosis: D49.4 Bladder Tumor  -CPT: 80221, 79810, [phone]  Surgery: Transurethral Resection of Bladder Tumor with intravesical instillation of Gemcitabine and bilateral Retrograde Pyelograms  Stop Anticoagulations: Yes, may continue ASA  Cardiac/Medical/Pulmonary Clearance needed: no  *Orders entered into EPIC  Date: 12/18/21   *Case booked in [facility]  12/17/2021  *Notified pt of Surgery: Date: 12/17/2021  PRE-OP UA & CX: no  *Placed into Prior Authorization Work Que Date: 12/18/21   Assistant/laser/rep:No

## 2021-12-18 NOTE — Telephone Encounter (Signed)
I spoke with Mr. Jonathan Small. We have discussed possible surgery dates and Monday October 9th, 2023 was agreed upon by all parties. Patient given information about surgery date, what to expect pre-operatively and post operatively.  We discussed that a Pre-Admission Testing office will be calling to set up the pre-op visit that will take place prior to surgery, and that these appointments are typically done over the phone with a Pre-Admissions RN.  Informed patient that our office will communicate any additional care to be provided after surgery. Patients questions or concerns were discussed during our call. Advised to call our office should there be any additional information, questions or concerns that arise. Patient verbalized understanding.

## 2021-12-19 ENCOUNTER — Encounter
Admission: RE | Admit: 2021-12-19 | Discharge: 2021-12-19 | Disposition: A | Discharge status: Home / Self Care | Payer: BC Managed Care – PPO | Source: Ambulatory Visit | Attending: Urology | Admitting: Urology

## 2021-12-19 ENCOUNTER — Other Ambulatory Visit: Payer: Self-pay

## 2021-12-19 DIAGNOSIS — K219 Gastro-esophageal reflux disease without esophagitis: Secondary | ICD-10-CM | POA: Diagnosis not present

## 2021-12-19 DIAGNOSIS — Z01812 Encounter for preprocedural laboratory examination: Secondary | ICD-10-CM

## 2021-12-19 DIAGNOSIS — Z8551 Personal history of malignant neoplasm of bladder: Secondary | ICD-10-CM | POA: Diagnosis not present

## 2021-12-19 DIAGNOSIS — Z01818 Encounter for other preprocedural examination: Secondary | ICD-10-CM | POA: Insufficient documentation

## 2021-12-19 DIAGNOSIS — Z01810 Encounter for preprocedural cardiovascular examination: Secondary | ICD-10-CM | POA: Diagnosis not present

## 2021-12-19 DIAGNOSIS — C679 Malignant neoplasm of bladder, unspecified: Secondary | ICD-10-CM | POA: Diagnosis not present

## 2021-12-19 LAB — CBC
HCT: 40.8 % (ref 39.0–52.0)
Hemoglobin: 13.6 g/dL (ref 13.0–17.0)
MCH: 30.6 pg (ref 26.0–34.0)
MCHC: 33.3 g/dL (ref 30.0–36.0)
MCV: 91.7 fL (ref 80.0–100.0)
Platelets: 365 10*3/uL (ref 150–400)
RBC: 4.45 MIL/uL (ref 4.22–5.81)
RDW: 12.2 % (ref 11.5–15.5)
WBC: 11.1 10*3/uL — ABNORMAL HIGH (ref 4.0–10.5)
nRBC: 0 % (ref 0.0–0.2)

## 2021-12-19 LAB — BASIC METABOLIC PANEL
Anion gap: 3 — ABNORMAL LOW (ref 5–15)
BUN: 18 mg/dL (ref 6–20)
CO2: 24 mmol/L (ref 22–32)
Calcium: 9 mg/dL (ref 8.9–10.3)
Chloride: 104 mmol/L (ref 98–111)
Creatinine, Ser: 1.45 mg/dL — ABNORMAL HIGH (ref 0.61–1.24)
GFR, Estimated: 56 mL/min — ABNORMAL LOW (ref >60)
Glucose, Bld: 109 mg/dL — ABNORMAL HIGH (ref 70–99)
Potassium: 3.8 mmol/L (ref 3.5–5.1)
Sodium: 131 mmol/L — ABNORMAL LOW (ref 135–145)

## 2021-12-19 LAB — CULTURE, URINE COMPREHENSIVE

## 2021-12-19 NOTE — Patient Instructions (Addendum)
Your procedure is scheduled on: 12/22/21 - Monday Report to the Registration Desk on the 1st floor of the [facility]. To find out your arrival time, please call [phone] between 1PM - 3PM on: 12/19/21 - Friday If your arrival time is 6:00 am, do not arrive prior to that time as the [place] entrance doors do not open until 6:00 am.  REMEMBER: Instructions that are not followed completely may result in serious medical risk, up to and including death; or upon the discretion of your surgeon and anesthesiologist your surgery may need to be rescheduled.  Do not eat food or drink any fluids after midnight the night before surgery.  No gum chewing, lozengers or hard candies.  TAKE THESE MEDICATIONS THE MORNING OF SURGERY WITH A SIP OF WATER:  - methimazole (TAPAZOLE)  - simvastatin (ZOCOR)  One week prior to surgery: Stop Anti-inflammatories (NSAIDS) such as Advil, Aleve, Ibuprofen, Motrin, Naproxen, Naprosyn and Aspirin based products such as Excedrin, Goodys Powder, BC Powder.  Stop ANY OVER THE COUNTER supplements until after surgery.  You may however, continue to take Tylenol if needed for pain up until the day of surgery.  No Alcohol for 24 hours before or after surgery.  No Smoking including e-cigarettes for 24 hours prior to surgery.  No chewable tobacco products for at least 6 hours prior to surgery.  No nicotine patches on the day of surgery.  Do not use any "recreational" drugs for at least a week prior to your surgery.  Please be advised that the combination of cocaine and anesthesia may have negative outcomes, up to and including death. If you test positive for cocaine, your surgery will be cancelled.  On the morning of surgery brush your teeth with toothpaste and water, you may rinse your mouth with mouthwash if you wish. Do not swallow any toothpaste or mouthwash.  Do not wear jewelry, make-up, hairpins, clips or nail polish.  Do not wear lotions, powders, or  perfumes.   Do not shave body from the neck down 48 hours prior to surgery just in case you cut yourself which could leave a site for infection.  Also, freshly shaved skin may become irritated if using the CHG soap.  Contact lenses, hearing aids and dentures may not be worn into surgery.  Do not bring valuables to the hospital. [facility] is not responsible for any missing/lost belongings or valuables.   Notify your doctor if there is any change in your medical condition (cold, fever, infection).  Wear comfortable clothing (specific to your surgery type) to the hospital.  After surgery, you can help prevent lung complications by doing breathing exercises.  Take deep breaths and cough every 1-2 hours. Your doctor may order a device called an Incentive Spirometer to help you take deep breaths. When coughing or sneezing, hold a pillow firmly against your incision with both hands. This is called "splinting." Doing this helps protect your incision. It also decreases belly discomfort.  If you are being admitted to the hospital overnight, leave your suitcase in the car. After surgery it may be brought to your room.  If you are being discharged the day of surgery, you will not be allowed to drive home. You will need a responsible adult (18 years or older) to drive you home and stay with you that night.   If you are taking public transportation, you will need to have a responsible adult (18 years or older) with you. Please confirm with your physician that  it is acceptable to use public transportation.   Please call the [place] Dept. at [phone] if you have any questions about these instructions.  Surgery Visitation Policy:  Patients undergoing a surgery or procedure may have two family members or support persons with them as long as the person is not COVID-19 positive or experiencing its symptoms.   Inpatient Visitation:    Visiting hours are 7 a.m. to 8 p.m. Up to  four visitors are allowed at one time in a patient room, including children. The visitors may rotate out with other people during the day. One designated support person (adult) may remain overnight.

## 2021-12-22 ENCOUNTER — Encounter
Admission: RE | Disposition: A | Discharge status: Home / Self Care | Payer: Self-pay | Source: Ambulatory Visit | Attending: Urology

## 2021-12-22 ENCOUNTER — Ambulatory Visit
Admission: RE | Admit: 2021-12-22 | Discharge: 2021-12-22 | Disposition: A | Discharge status: Home / Self Care | Payer: BC Managed Care – PPO | Source: Ambulatory Visit | Attending: Urology | Admitting: Urology

## 2021-12-22 ENCOUNTER — Encounter: Payer: Self-pay | Admitting: Urology

## 2021-12-22 ENCOUNTER — Ambulatory Visit: Payer: BC Managed Care – PPO | Admitting: Anesthesiology

## 2021-12-22 ENCOUNTER — Ambulatory Visit: Payer: BC Managed Care – PPO

## 2021-12-22 ENCOUNTER — Other Ambulatory Visit: Payer: Self-pay

## 2021-12-22 DIAGNOSIS — I10 Essential (primary) hypertension: Secondary | ICD-10-CM | POA: Diagnosis not present

## 2021-12-22 DIAGNOSIS — Z8551 Personal history of malignant neoplasm of bladder: Secondary | ICD-10-CM | POA: Diagnosis not present

## 2021-12-22 DIAGNOSIS — D464 Refractory anemia, unspecified: Secondary | ICD-10-CM | POA: Diagnosis not present

## 2021-12-22 DIAGNOSIS — E7800 Pure hypercholesterolemia, unspecified: Secondary | ICD-10-CM | POA: Diagnosis not present

## 2021-12-22 DIAGNOSIS — K219 Gastro-esophageal reflux disease without esophagitis: Secondary | ICD-10-CM | POA: Insufficient documentation

## 2021-12-22 DIAGNOSIS — C679 Malignant neoplasm of bladder, unspecified: Secondary | ICD-10-CM | POA: Diagnosis not present

## 2021-12-22 DIAGNOSIS — D494 Neoplasm of unspecified behavior of bladder: Secondary | ICD-10-CM

## 2021-12-22 DIAGNOSIS — D090 Carcinoma in situ of bladder: Secondary | ICD-10-CM | POA: Diagnosis not present

## 2021-12-22 DIAGNOSIS — M1990 Unspecified osteoarthritis, unspecified site: Secondary | ICD-10-CM | POA: Diagnosis not present

## 2021-12-22 HISTORY — PX: CYSTOSCOPY W/ RETROGRADES: SHX1426

## 2021-12-22 HISTORY — PX: BLADDER INSTILLATION: SHX6893

## 2021-12-22 HISTORY — PX: TRANSURETHRAL RESECTION OF BLADDER TUMOR: SHX2575

## 2021-12-22 SURGERY — TURBT (TRANSURETHRAL RESECTION OF BLADDER TUMOR)
Anesthesia: General

## 2021-12-22 MED ORDER — ROCURONIUM BROMIDE 100 MG/10ML IV SOLN
INTRAVENOUS | Status: DC | PRN
  Administered 2021-12-22: 50 mg via INTRAVENOUS

## 2021-12-22 MED ORDER — PROPOFOL 10 MG/ML IV BOLUS
INTRAVENOUS | Status: DC | PRN
  Administered 2021-12-22: 200 mg via INTRAVENOUS

## 2021-12-22 MED ORDER — DEXAMETHASONE SODIUM PHOSPHATE 10 MG/ML IJ SOLN
INTRAMUSCULAR | Status: AC
  Filled 2021-12-22: qty 1

## 2021-12-22 MED ORDER — LIDOCAINE HCL (CARDIAC) PF 100 MG/5ML IV SOSY
PREFILLED_SYRINGE | INTRAVENOUS | Status: DC | PRN
  Administered 2021-12-22: 100 mg via INTRAVENOUS

## 2021-12-22 MED ORDER — CHLORHEXIDINE GLUCONATE 0.12 % MT SOLN: 15.0000 mL | Freq: Once | OROMUCOSAL | Status: AC

## 2021-12-22 MED ORDER — CHLORHEXIDINE GLUCONATE 0.12 % MT SOLN
OROMUCOSAL | Status: AC
  Administered 2021-12-22: 15 mL via OROMUCOSAL
  Filled 2021-12-22: qty 15

## 2021-12-22 MED ORDER — FENTANYL CITRATE (PF) 100 MCG/2ML IJ SOLN: 25.0000 ug | INTRAMUSCULAR | Status: DC | PRN

## 2021-12-22 MED ORDER — STERILE WATER FOR IRRIGATION IR SOLN
Status: DC | PRN
  Administered 2021-12-22: 3000 mL

## 2021-12-22 MED ORDER — ONDANSETRON HCL 4 MG/2ML IJ SOLN
INTRAMUSCULAR | Status: AC
  Filled 2021-12-22: qty 2

## 2021-12-22 MED ORDER — ONDANSETRON HCL 4 MG/2ML IJ SOLN
INTRAMUSCULAR | Status: DC | PRN
  Administered 2021-12-22: 4 mg via INTRAVENOUS

## 2021-12-22 MED ORDER — MIDAZOLAM HCL 2 MG/2ML IJ SOLN
INTRAMUSCULAR | Status: DC | PRN
  Administered 2021-12-22: 2 mg via INTRAVENOUS

## 2021-12-22 MED ORDER — CEFAZOLIN SODIUM-DEXTROSE 2-4 GM/100ML-% IV SOLN
INTRAVENOUS | Status: AC
  Filled 2021-12-22: qty 100

## 2021-12-22 MED ORDER — GEMCITABINE CHEMO FOR BLADDER INSTILLATION 2000 MG
2000.0000 mg | Freq: Once | INTRAVENOUS | Status: DC
  Filled 2021-12-22: qty 52.6

## 2021-12-22 MED ORDER — LACTATED RINGERS IV SOLN: INTRAVENOUS | Status: DC

## 2021-12-22 MED ORDER — MIDAZOLAM HCL 2 MG/2ML IJ SOLN
INTRAMUSCULAR | Status: AC
  Filled 2021-12-22: qty 2

## 2021-12-22 MED ORDER — FENTANYL CITRATE (PF) 100 MCG/2ML IJ SOLN
INTRAMUSCULAR | Status: AC
  Filled 2021-12-22: qty 2

## 2021-12-22 MED ORDER — SUGAMMADEX SODIUM 200 MG/2ML IV SOLN
INTRAVENOUS | Status: DC | PRN
  Administered 2021-12-22: 200 mg via INTRAVENOUS

## 2021-12-22 MED ORDER — GLYCOPYRROLATE 0.2 MG/ML IJ SOLN
INTRAMUSCULAR | Status: DC | PRN
  Administered 2021-12-22: .2 mg via INTRAVENOUS

## 2021-12-22 MED ORDER — FAMOTIDINE 20 MG PO TABS
ORAL_TABLET | ORAL | Status: AC
  Administered 2021-12-22: 20 mg via ORAL
  Filled 2021-12-22: qty 1

## 2021-12-22 MED ORDER — OXYCODONE HCL 5 MG PO TABS: 5.0000 mg | ORAL_TABLET | Freq: Once | ORAL | Status: DC | PRN

## 2021-12-22 MED ORDER — IOHEXOL 180 MG/ML  SOLN
INTRAMUSCULAR | Status: DC | PRN
  Administered 2021-12-22: 20 mL

## 2021-12-22 MED ORDER — ORAL CARE MOUTH RINSE: 15.0000 mL | Freq: Once | OROMUCOSAL | Status: AC

## 2021-12-22 MED ORDER — GEMCITABINE CHEMO FOR BLADDER INSTILLATION 2000 MG
INTRAVENOUS | Status: DC | PRN
  Administered 2021-12-22: 2000 mg via INTRAVESICAL

## 2021-12-22 MED ORDER — FAMOTIDINE 20 MG PO TABS: 20.0000 mg | ORAL_TABLET | Freq: Once | ORAL | Status: AC

## 2021-12-22 MED ORDER — DEXAMETHASONE SODIUM PHOSPHATE 10 MG/ML IJ SOLN
INTRAMUSCULAR | Status: DC | PRN
  Administered 2021-12-22: 10 mg via INTRAVENOUS

## 2021-12-22 MED ORDER — CEFAZOLIN SODIUM-DEXTROSE 2-4 GM/100ML-% IV SOLN
2.0000 g | INTRAVENOUS | Status: AC
  Administered 2021-12-22: 2 g via INTRAVENOUS

## 2021-12-22 MED ORDER — FENTANYL CITRATE (PF) 100 MCG/2ML IJ SOLN
INTRAMUSCULAR | Status: DC | PRN
  Administered 2021-12-22 (×2): 50 ug via INTRAVENOUS

## 2021-12-22 MED ORDER — OXYCODONE HCL 5 MG/5ML PO SOLN: 5.0000 mg | Freq: Once | ORAL | Status: DC | PRN

## 2021-12-22 SURGICAL SUPPLY — 28 items
BAG DRAIN SIEMENS DORNER NS (MISCELLANEOUS) ×2 IMPLANT
BAG DRN NS LF (MISCELLANEOUS) ×2
BAG DRN RND TRDRP ANRFLXCHMBR (UROLOGICAL SUPPLIES) ×2
BAG URINE DRAIN 2000ML AR STRL (UROLOGICAL SUPPLIES) ×2 IMPLANT
BRUSH SCRUB EZ 1% IODOPHOR (MISCELLANEOUS) ×2 IMPLANT
CATH FOLEY 2WAY  5CC 16FR (CATHETERS) ×2
CATH FOLEY 2WAY 5CC 16FR (CATHETERS) ×2
CATH URETL OPEN 5X70 (CATHETERS) ×2 IMPLANT
CATH URTH 16FR FL 2W BLN LF (CATHETERS) ×2 IMPLANT
DRAPE UTILITY 15X26 TOWEL STRL (DRAPES) ×2 IMPLANT
DRSG TELFA 3X4 N-ADH STERILE (GAUZE/BANDAGES/DRESSINGS) ×2 IMPLANT
ELECT REM PT RETURN 9FT ADLT (ELECTROSURGICAL) ×2
ELECTRODE REM PT RTRN 9FT ADLT (ELECTROSURGICAL) IMPLANT
GAUZE 4X4 16PLY ~~LOC~~+RFID DBL (SPONGE) ×4 IMPLANT
GLOVE BIO SURGEON STRL SZ 6.5 (GLOVE) ×2 IMPLANT
GOWN STRL REUS W/ TWL LRG LVL3 (GOWN DISPOSABLE) ×4 IMPLANT
GOWN STRL REUS W/TWL LRG LVL3 (GOWN DISPOSABLE) ×4
GUIDEWIRE STR DUAL SENSOR (WIRE) ×2 IMPLANT
KIT TURNOVER CYSTO (KITS) ×2 IMPLANT
PACK CYSTO AR (MISCELLANEOUS) ×2 IMPLANT
PAD ARMBOARD 7.5X6 YLW CONV (MISCELLANEOUS) ×2 IMPLANT
SET CYSTO W/LG BORE CLAMP LF (SET/KITS/TRAYS/PACK) ×2 IMPLANT
SET IRRIG Y TYPE TUR BLADDER L (SET/KITS/TRAYS/PACK) ×2 IMPLANT
SURGILUBE 2OZ TUBE FLIPTOP (MISCELLANEOUS) ×2 IMPLANT
SYR TOOMEY IRRIG 70ML (MISCELLANEOUS) ×2
SYRINGE TOOMEY IRRIG 70ML (MISCELLANEOUS) ×2 IMPLANT
WATER STERILE IRR 3000ML UROMA (IV SOLUTION) IMPLANT
WATER STERILE IRR 500ML POUR (IV SOLUTION) ×2 IMPLANT

## 2021-12-22 NOTE — Anesthesia Preprocedure Evaluation (Signed)
Anesthesia Evaluation  Patient identified by MRN, date of birth, ID band Patient awake    Reviewed: Allergy & Precautions, NPO status , Patient's Chart, lab work & pertinent test results  History of Anesthesia Complications (+) PONV, Family history of anesthesia reaction and history of anesthetic complications  Airway Mallampati: III  TM Distance: >3 FB Neck ROM: full    Dental  (+) Chipped, Poor Dentition, Missing   Pulmonary neg pulmonary ROS, neg shortness of breath, former smoker,    Pulmonary exam normal        Cardiovascular Exercise Tolerance: Good hypertension, (-) angina(-) Past MI and (-) DOE Normal cardiovascular exam     Neuro/Psych Seizures -,  negative psych ROS   GI/Hepatic Neg liver ROS, GERD  Controlled,  Endo/Other  Hyperthyroidism   Renal/GU      Musculoskeletal   Abdominal   Peds  Hematology negative hematology ROS (+)   Anesthesia Other Findings Past Medical History: 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     Comment:  Brain MRI-BENIGN No date: Arthritis No date: Cancer ([place]) No date: Complication of anesthesia 06/08/2013: Dizziness and giddiness No date: Family history of adverse reaction to anesthesia     Comment:  sister-hard to wake up No date: GERD (gastroesophageal reflux disease) No date: Hypercholesteremia No date: Hypertension No date: Hyperthyroidism 06/08/2013: Localization-related (focal) (partial) epilepsy and  epileptic syndromes with complex partial seizures, without mention of  intractable epilepsy No date: Polyp of colon No date: PONV (postoperative nausea and vomiting)     Comment:  with 1st colonoscopy only  Past Surgical History: No date: COLONOSCOPY 11/30/2019: CYSTOSCOPY W/ RETROGRADES; Bilateral     Comment:  Procedure: CYSTOSCOPY WITH RETROGRADE PYELOGRAM;                Surgeon: Hollice Espy, MD;  Location: [facility] ORS;                Service: Urology;   Laterality: Bilateral; 12/30/2020: CYSTOSCOPY W/ RETROGRADES; Bilateral     Comment:  Procedure: CYSTOSCOPY WITH RETROGRADE PYELOGRAM;                Surgeon: Hollice Espy, MD;  Location: [facility] ORS;                Service: Urology;  Laterality: Bilateral; No date: MENISCUS REPAIR; Right 12/30/2020: TRANSURETHRAL RESECTION OF BLADDER TUMOR; N/A     Comment:  Procedure: TRANSURETHRAL RESECTION OF BLADDER TUMOR               (TURBT) WITH GEMCITABINE;  Surgeon: Hollice Espy, MD;               Location: [facility] ORS;  Service: Urology;  Laterality: N/A; 11/30/2019: TRANSURETHRAL RESECTION OF BLADDER TUMOR WITH MITOMYCIN- C; N/A     Comment:  Procedure: TRANSURETHRAL RESECTION OF BLADDER TUMOR WITH              Gemcitabine;  Surgeon: Hollice Espy, MD;  Location:               [facility] ORS;  Service: Urology;  Laterality: N/A; No date: WISDOM TOOTH EXTRACTION  BMI    Body Mass Index: 27.35 kg/m      Reproductive/Obstetrics negative OB ROS                             Anesthesia Physical Anesthesia Plan  ASA: 3  Anesthesia Plan: General ETT   Post-op Pain Management:    Induction:  Intravenous  PONV Risk Score and Plan: Ondansetron, Dexamethasone, Midazolam and Treatment may vary due to age or medical condition  Airway Management Planned: Oral ETT  Additional Equipment:   Intra-op Plan:   Post-operative Plan: Extubation in OR  Informed Consent: I have reviewed the patients History and Physical, chart, labs and discussed the procedure including the risks, benefits and alternatives for the proposed anesthesia with the patient or authorized representative who has indicated his/her understanding and acceptance.     Dental Advisory Given  Plan Discussed with: Anesthesiologist, CRNA and Surgeon  Anesthesia Plan Comments: (Patient consented for risks of anesthesia including but not limited to:  - adverse reactions to medications - damage to eyes, teeth,  lips or other oral mucosa - nerve damage due to positioning  - sore throat or hoarseness - Damage to heart, brain, nerves, lungs, other parts of body or loss of life  Patient voiced understanding.)        Anesthesia Quick Evaluation

## 2021-12-22 NOTE — Op Note (Signed)
Date of procedure: 12/22/21  Preoperative diagnosis:  History of bladder cancer Left lateral dome tumor  Postoperative diagnosis:  Same as above Left bladder wall erythema, suspicious  Procedure: Cystoscopy Bilateral retrograde pyelogram TURBT, small Left bladder wall biopsy Instillation of intravesical gemcitabine  Surgeon: Hollice Espy, MD  Anesthesia: General  Complications: None  Intraoperative findings: Papillary carpeting at the left dome of the bladder most consistent with early recurrence versus CIS.  There is also an area of erythema just beyond the left UO on the lateral wall concerning for underlying pathology and as such this was also biopsied.  Bilateral retrograde pyelogram normal.  EBL: Minimal  Specimens: Left lateral bladder wall tumor  Drains: 16 French Foley catheter with 5 cc in the balloon  Indication: Jonathan Small is a 61 y.o. patient with carpeting papillary tumor at the left dome concerning for recurrence in the setting of known history of bladder cancer.  After reviewing the management options for treatment, he elected to proceed with the above surgical procedure(s). We have discussed the potential benefits and risks of the procedure, side effects of the proposed treatment, the likelihood of the patient achieving the goals of the procedure, and any potential problems that might occur during the procedure or recuperation. Informed consent has been obtained.  Description of procedure:  The patient was taken to the operating room and general anesthesia was induced.  The patient was placed in the dorsal lithotomy position, prepped and draped in the usual sterile fashion, and preoperative antibiotics were administered. A preoperative time-out was performed.   4 French cystoscope was advanced per urethra into the bladder.  Notably, there are some narrowing at the bulbar urethra but the 21 [place] scope was easily able to pass this.  Upon inspection of the  bladder, there were 2 concerning areas.  One was at the left lateral dome of the bladder with a papillary carpeting, approximately 1 cm in diameter which appeared to be consistent with early recurrence versus CIS of the bladder.  There is also an area of erythema just beyond the left UO which is also somewhat concerning.  This was not overtly papillary in nature.  There are no other suspicious areas in the bladder lining.  There was some scarring just lateral to the right UO at the previous tumor site.  Next, attention was turned to the right UO.  Is intubated using 5 [place] open-ended ureteral catheter just within the UO.  Gentle retrograde pyelogram on the side showed no hydroureteronephrosis or filling defects.  The same exact procedure was performed on the left which was also not suspicious without hydronephrosis or filling defects.  The ureters were delicate bilaterally.  Next, cold cup biopsies were used to resect the left bladder tumor and a piece wise fashion, approximately 5 or so small biopsies taken in order to completely remove the entirety of the tumor which were superficial in nature.  Bovie electrocautery was then used to completely fulgurate this area and the surrounding tissue both for hemostasis as well as for the purpose of tissue destruction.  Next, the area just lateral to the left UO was biopsied using cold cup biopsy forceps.  This area was also fulgurated.  The bladder was inspected 1 additional time, no active bleeding was noted and no additional areas of concern were appreciated.  The bladder was then drained and the scope was removed.  A 16 French Foley catheter was then inserted and the balloon was filled with 5 cc of saline.  He was cleaned and dried, repositioned in supine position, reversed from anesthesia, and taken to the PACU in stable condition.  2000 mg of intravesical gemcitabine was instilled into the bladder.  It was allowed to dwell in the PACU for an hour.  The end of  an hour, the chemo was drained and the Foley catheter was removed.  Plan: We will plan to call him with his pathology results and follow-up plan.  Hollice Espy, M.D.

## 2021-12-22 NOTE — Transfer of Care (Signed)
Immediate Anesthesia Transfer of Care Note  Patient: Jonathan Small  Procedure(s) Performed: TRANSURETHRAL RESECTION OF BLADDER TUMOR (TURBT) BLADDER INSTILLATION OF GEMCITABINE CYSTOSCOPY WITH RETROGRADE PYELOGRAM (Bilateral)  Patient Location: PACU  Anesthesia Type:General  Level of Consciousness: sedated  Airway & Oxygen Therapy: Patient Spontanous Breathing and Patient connected to face mask oxygen  Post-op Assessment: Report given to RN and Post -op Vital signs reviewed and stable  Post vital signs: Reviewed  Last Vitals:  Vitals Value Taken Time  BP    Temp    Pulse 57 12/22/21 1436  Resp 16 12/22/21 1436  SpO2 98 % 12/22/21 1436  Vitals shown include unvalidated device data.  Last Pain:  Vitals:   12/22/21 1240  TempSrc: Temporal  PainSc: 0-No pain         Complications: No notable events documented.

## 2021-12-22 NOTE — Progress Notes (Signed)
Patient awake/alert x4.  Indwelling foley catheter removed without event, tolerated procedure.

## 2021-12-22 NOTE — Discharge Instructions (Addendum)
Transurethral Resection of Bladder Tumor (TURBT) or Bladder Biopsy   Definition:  Transurethral Resection of the Bladder Tumor is a surgical procedure used to diagnose and remove tumors within the bladder. TURBT is the most common treatment for early stage bladder cancer.  General instructions:     Your recent bladder surgery requires very little post hospital care but some definite precautions.  Despite the fact that no skin incisions were used, the area around the bladder incisions are raw and covered with scabs to promote healing and prevent bleeding. Certain precautions are needed to insure that the scabs are not disturbed over the next 2-4 weeks while the healing proceeds.  Because the raw surface inside your bladder and the irritating effects of urine you may expect frequency of urination and/or urgency (a stronger desire to urinate) and perhaps even getting up at night more often. This will usually resolve or improve slowly over the healing period. You may see some blood in your urine over the first 6 weeks. Do not be alarmed, even if the urine was clear for a while. Get off your feet and drink lots of fluids until clearing occurs. If you start to pass clots or don't improve call us.  Diet:  You may return to your normal diet immediately. Because of the raw surface of your bladder, alcohol, spicy foods, foods high in acid and drinks with caffeine may cause irritation or frequency and should be used in moderation. To keep your urine flowing freely and avoid constipation, drink plenty of fluids during the day (8-10 glasses). Tip: Avoid cranberry juice because it is very acidic.  Activity:  Your physical activity doesn't need to be restricted. However, if you are very active, you may see some blood in the urine. We suggest that you reduce your activity under the circumstances until the bleeding has stopped.  Bowels:  It is important to keep your bowels regular during the postoperative  period. Straining with bowel movements can cause bleeding. A bowel movement every other day is reasonable. Use a mild laxative if needed, such as milk of magnesia 2-3 tablespoons, or 2 Dulcolax tablets. Call if you continue to have problems. If you had been taking narcotics for pain, before, during or after your surgery, you may be constipated. Take a laxative if necessary.    Medication:  You should resume your pre-surgery medications unless told not to. In addition you may be given an antibiotic to prevent or treat infection. Antibiotics are not always necessary. All medication should be taken as prescribed until the bottles are finished unless you are having an unusual reaction to one of the drugs.   [facility] [place], [place] [address] [phone]       AMBULATORY SURGERY  DISCHARGE INSTRUCTIONS   The drugs that you were given will stay in your system until tomorrow so for the next 24 hours you should not:  Drive an automobile Make any legal decisions Drink any alcoholic beverage   You may resume regular meals tomorrow.  Today it is better to start with liquids and gradually work up to solid foods.  You may eat anything you prefer, but it is better to start with liquids, then soup and crackers, and gradually work up to solid foods.   Please notify your doctor immediately if you have any unusual bleeding, trouble breathing, redness and pain at the surgery site, drainage, fever, or pain not relieved by medication.     Your post-operative visit with Dr.                                         is: Date:                        Time:    Please call to schedule your post-operative visit.  Additional Instructions: 

## 2021-12-22 NOTE — Interval H&P Note (Signed)
History and Physical Interval Note:  12/22/2021 1:25 PM  Jonathan Small  has presented today for surgery, with the diagnosis of Bladder Tumor.  The various methods of treatment have been discussed with the patient and family. After consideration of risks, benefits and other options for treatment, the patient has consented to  Procedure(s): TRANSURETHRAL RESECTION OF BLADDER TUMOR (TURBT) (N/A) BLADDER INSTILLATION OF GEMCITABINE (N/A) CYSTOSCOPY WITH RETROGRADE PYELOGRAM (Bilateral) as a surgical intervention.  The patient's history has been reviewed, patient examined, no change in status, stable for surgery.  I have reviewed the patient's chart and labs.  Questions were answered to the patient's satisfaction.    RRR CTAB   Hollice Espy

## 2021-12-22 NOTE — Anesthesia Procedure Notes (Signed)
Procedure Name: Intubation Date/Time: 12/22/2021 2:02 PM  Performed by: Rolla Plate, CRNAPre-anesthesia Checklist: Patient identified, Patient being monitored, Timeout performed, Emergency Drugs available and Suction available Patient Re-evaluated:Patient Re-evaluated prior to induction Oxygen Delivery Method: Circle system utilized Preoxygenation: Pre-oxygenation with 100% oxygen Induction Type: IV induction Ventilation: Mask ventilation without difficulty Laryngoscope Size: McGraph Grade View: Grade I Tube type: Oral Tube size: 7.0 mm Number of attempts: 1 Airway Equipment and Method: Stylet and Video-laryngoscopy Placement Confirmation: ETT inserted through vocal cords under direct vision, positive ETCO2 and breath sounds checked- equal and bilateral Secured at: 22 cm Tube secured with: Tape Dental Injury: Teeth and Oropharynx as per pre-operative assessment

## 2021-12-23 ENCOUNTER — Encounter: Payer: Self-pay | Admitting: Urology

## 2021-12-23 NOTE — Anesthesia Postprocedure Evaluation (Signed)
Anesthesia Post Note  Patient: Jonathan Small  Procedure(s) Performed: TRANSURETHRAL RESECTION OF BLADDER TUMOR (TURBT) BLADDER INSTILLATION OF GEMCITABINE CYSTOSCOPY WITH RETROGRADE PYELOGRAM (Bilateral)  Patient location during evaluation: PACU Anesthesia Type: General Level of consciousness: awake and alert Pain management: pain level controlled Vital Signs Assessment: post-procedure vital signs reviewed and stable Respiratory status: spontaneous breathing, nonlabored ventilation, respiratory function stable and patient connected to nasal cannula oxygen Cardiovascular status: blood pressure returned to baseline and stable Postop Assessment: no apparent nausea or vomiting Anesthetic complications: no   No notable events documented.   Last Vitals:  Vitals:   12/22/21 1608 12/22/21 1616  BP: (!) 180/87 (!) 175/92  Pulse: (!) 59 69  Resp: 18 18  Temp: (!) 35.9 C   SpO2: 99% 100%    Last Pain:  Vitals:   12/22/21 1616  TempSrc:   PainSc: 0-No pain                 Precious Haws Patsye Sullivant

## 2021-12-24 LAB — SURGICAL PATHOLOGY

## 2021-12-31 ENCOUNTER — Ambulatory Visit: Payer: BC Managed Care – PPO | Admitting: Urology

## 2021-12-31 ENCOUNTER — Encounter: Payer: Self-pay | Admitting: Urology

## 2021-12-31 ENCOUNTER — Ambulatory Visit (INDEPENDENT_AMBULATORY_CARE_PROVIDER_SITE_OTHER): Payer: BC Managed Care – PPO | Admitting: Urology

## 2021-12-31 VITALS — BP 126/75 | HR 77 | Ht 68.0 in | Wt 188.0 lb

## 2021-12-31 DIAGNOSIS — C672 Malignant neoplasm of lateral wall of bladder: Secondary | ICD-10-CM

## 2021-12-31 NOTE — Progress Notes (Signed)
12/31/2021 2:44 PM   Kerrie Buffalo 1960/09/19 [id number]  Referring provider: Lawerance Cruel, MD [place],  [place] [address]  Chief Complaint  Patient presents with   Post-op Follow-up    HPI: 61 y.o. male with a personal history of bladder cancer, who presents today for a surveillance cystoscopy.    He is s/p TURBT on 11/2019 after which revealed incidental bladder tumor. 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 Upper tract imaging showed unremarkable.    He had a repeat cysto on 12/03/2020 that showed fine papillary changes at the 5 o'clock and 3 o'clock concerning for a satellite tumors, < 5 mm each.    He returned to the OR for repeat TURBT on 12/30/2020. Intraoperative findings: Carpeting-like papillary tumor just above the right UO at the 7 o'clock position of his previous TURBT stellate scar.  This measured approximately 1 cm and did not involve the right UO but in close proximity.  There is also some erythema at the 1 o'clock position of a stellate scar concerning for possible underlying CIS which was biopsied.  No other bladder bladder pathology identified.   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   He elected to undergo induction BCG x6. He received his last BCG on 03/25/2021.  Last cysto concerning for recurrence.  In the OR on 12/22/21 on papillary carpeting at the left dome of the bladder most consistent with early recurrence versus CIS.  There is also an area of erythema just beyond the left UO on the lateral wall concerning for underlying pathology and as such this was also biopsied.  Bilateral retrograde pyelogram normal.  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  No postop issues.   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   Brain MRI-BENIGN   Arthritis     Cancer ([place])    Complication of anesthesia    Dizziness and giddiness 06/08/2013   Family history of adverse reaction to anesthesia    sister-hard to wake up   GERD (gastroesophageal reflux disease)    Hypercholesteremia    Hypertension    Hyperthyroidism    Localization-related (focal) (partial) epilepsy and epileptic syndromes with complex partial seizures, without mention of intractable epilepsy 06/08/2013   Polyp of colon    PONV (postoperative nausea and vomiting)    with 1st colonoscopy only    Surgical History: Past Surgical History:  Procedure Laterality Date   BLADDER INSTILLATION N/A 12/22/2021   Procedure: BLADDER INSTILLATION OF GEMCITABINE;  Surgeon: Hollice Espy, MD;  Location: [facility];  Service: Urology;  Laterality: N/A;   COLONOSCOPY     CYSTOSCOPY W/ RETROGRADES Bilateral 11/30/2019   Procedure: CYSTOSCOPY WITH RETROGRADE PYELOGRAM;  Surgeon: Hollice Espy, MD;  Location: [facility];  Service: Urology;  Laterality: Bilateral;   CYSTOSCOPY W/ RETROGRADES Bilateral 12/30/2020   Procedure: CYSTOSCOPY WITH RETROGRADE PYELOGRAM;  Surgeon: Hollice Espy, MD;  Location: [facility];  Service: Urology;  Laterality: Bilateral;   CYSTOSCOPY W/ RETROGRADES Bilateral 12/22/2021   Procedure: CYSTOSCOPY WITH RETROGRADE PYELOGRAM;  Surgeon: Hollice Espy, MD;  Location: [facility];  Service: Urology;  Laterality: Bilateral;   MENISCUS REPAIR Right    TRANSURETHRAL RESECTION OF BLADDER TUMOR N/A 12/30/2020   Procedure: TRANSURETHRAL RESECTION OF BLADDER TUMOR (TURBT) WITH GEMCITABINE;  Surgeon: Hollice Espy, MD;  Location: [facility];  Service: Urology;  Laterality: N/A;   TRANSURETHRAL  RESECTION OF BLADDER TUMOR N/A 12/22/2021   Procedure: TRANSURETHRAL RESECTION OF BLADDER TUMOR (TURBT);  Surgeon: Hollice Espy, MD;  Location: [facility];  Service: Urology;  Laterality: N/A;   TRANSURETHRAL RESECTION OF BLADDER TUMOR WITH MITOMYCIN-C N/A 11/30/2019   Procedure: TRANSURETHRAL  RESECTION OF BLADDER TUMOR WITH Gemcitabine;  Surgeon: Hollice Espy, MD;  Location: [facility];  Service: Urology;  Laterality: N/A;   WISDOM TOOTH EXTRACTION      Home Medications:  Allergies as of 12/31/2021       Reactions   Benicar [olmesartan] Rash        Medication List        Accurate as of December 31, 2021  2:44 PM. If you have any questions, ask your nurse or doctor.          acetaminophen 500 MG tablet Commonly known as: TYLENOL Take 1,000 mg by mouth every 6 (six) hours as needed for mild pain.   calcium carbonate 500 MG chewable tablet Commonly known as: TUMS - dosed in mg elemental calcium Chew 1 tablet by mouth as needed for indigestion or heartburn.   diclofenac Sodium 1 % Gel Commonly known as: VOLTAREN Apply 2 g topically daily as needed (knee or elbow pain).   fluticasone 50 MCG/ACT nasal spray Commonly known as: FLONASE Place 1 spray into both nostrils at bedtime as needed (congestion).   loratadine 10 MG tablet Commonly known as: CLARITIN Take 10 mg by mouth daily.   methimazole 5 MG tablet Commonly known as: TAPAZOLE Take 5 mg by mouth daily.   simvastatin 40 MG tablet Commonly known as: ZOCOR Take 40 mg by mouth every morning.   SYSTANE OP Place 1 drop into both eyes 2 (two) times daily as needed (irritation).   telmisartan-hydrochlorothiazide 80-12.5 MG tablet Commonly known as: MICARDIS HCT Take 1 tablet by mouth every morning.   valACYclovir 500 MG tablet Commonly known as: VALTREX Take 500 mg by mouth daily as needed (Fever blisters).        Allergies:  Allergies  Allergen Reactions   Benicar [Olmesartan] Rash    Family History: Family History  Problem Relation Age of Onset   Hypercholesterolemia Mother    Hypertension Mother    Hypertension Father    Hypercholesterolemia Father    Stroke Father    Diabetes Father    Cancer Maternal Grandmother    Cancer Maternal Grandfather    Seizures Neg Hx     Social  History:  reports that he quit smoking about 12 years ago. His smoking use included cigarettes. He has a 20.00 pack-year smoking history. He has never used smokeless tobacco. He reports current alcohol use. He reports that he does not use drugs.   Physical Exam: BP 126/75   Pulse 77   Ht '5\' 8"'$  (1.727 m)   Wt 188 lb (85.3 kg)   BMI 28.59 kg/m   Constitutional:  Alert and oriented, No acute distress. HEENT: [place] AT, moist mucus membranes.  Trachea midline, no masses. Cardiovascular: No clubbing, cyanosis, or edema. Respiratory: Normal respiratory effort, no increased work of breathing. Neurologic: Grossly intact, no focal deficits, moving all 4 extremities. Psychiatric: Normal mood and affect.  Laboratory Data: Lab Results  Component Value Date   WBC 11.1 (H) 12/19/2021   HGB 13.6 12/19/2021   HCT 40.8 12/19/2021   MCV 91.7 12/19/2021   PLT 365 12/19/2021    Lab Results  Component Value Date   CREATININE 1.45 (H) 12/19/2021     Assessment &  Plan:    1. 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  Efficacy rates of each were discussed as well.  He would like to try another reinduction course of BCG x6.  If this fails, would more strongly recommend aggressive intervention, possibly in the form of cystectomy given his very young age and excellent health.  BCG was discussed again at length today.  He recalls the procedure along with risk and benefits.  Hollice Espy, MD  [facility] [address] [place], [place] [address] [phone]

## 2022-01-07 ENCOUNTER — Ambulatory Visit: Payer: BC Managed Care – PPO | Admitting: Urology

## 2022-01-13 ENCOUNTER — Telehealth: Payer: Self-pay | Admitting: Urology

## 2022-01-13 NOTE — Telephone Encounter (Signed)
Pt called, wanted to set up BCG treatments. Please call  [phone]

## 2022-01-13 NOTE — Telephone Encounter (Signed)
Pt scheduled for induction BCG series, pt confirmed.

## 2022-01-14 ENCOUNTER — Telehealth: Payer: Self-pay

## 2022-01-14 NOTE — Patient Outreach (Signed)
  Care Coordination   Initial Visit Note   01/14/2022 Name: ANDRY BOGDEN MRN: [id number] DOB: 09-25-1960  Kerrie Buffalo is a 61 y.o. year old male who sees Lawerance Cruel, MD for primary care. I spoke with  Kerrie Buffalo by phone today.  What matters to the patients health and wellness today?  I am doing good other than my cancer.  I don't need anything today. Does not feel he needs care coordination at this time   Goals Addressed             This Visit's Progress    COMPLETED: Care Coordination Activities - no follow up required       Care Coordination Interventions: Provided education to patient re: care coordination services, encouraged regular follow up with primary care provider Assessed social determinant of health barriers          SDOH assessments and interventions completed:  Yes  SDOH Interventions Today    Flowsheet Row Most Recent Value  SDOH Interventions   Housing Interventions Intervention Not Indicated  Transportation Interventions Intervention Not Indicated        Care Coordination Interventions Activated:  Yes  Care Coordination Interventions:  Yes, provided   Follow up plan: No further intervention required.   Encounter Outcome:  Pt. Visit Completed  Peter Garter RN, BSN,CCM, CDE Care Management Coordinator [facility] [phone]

## 2022-01-14 NOTE — Patient Instructions (Signed)
Visit Information  Thank you for taking time to visit with me today. Please don't hesitate to contact me if I can be of assistance to you.   Following are the goals we discussed today:   Goals Addressed             This Visit's Progress    COMPLETED: Care Coordination Activities - no follow up required       Care Coordination Interventions: Provided education to patient re: care coordination services, encouraged regular follow up with primary care provider Assessed social determinant of health barriers           If you are experiencing a Mental Health or [place] or need someone to talk to, please call the Suicide and Crisis Lifeline: 988 call the [place] National Suicide Prevention Lifeline: 778-277-7395 or TTY: 772-160-5068 TTY (432)076-7833) to talk to a trained counselor call 1-800-273-TALK (toll free, 24 hour hotline) go to [facility] [phone]) call 911   The patient verbalized understanding of instructions, educational materials, and care plan provided today and DECLINED offer to receive copy of patient instructions, educational materials, and care plan.   No further follow up required:    Peter Garter RN, Jackquline Denmark, [facility] [phone]

## 2022-02-03 ENCOUNTER — Ambulatory Visit: Payer: BC Managed Care – PPO | Admitting: Physician Assistant

## 2022-02-03 DIAGNOSIS — D494 Neoplasm of unspecified behavior of bladder: Secondary | ICD-10-CM

## 2022-02-03 DIAGNOSIS — C679 Malignant neoplasm of bladder, unspecified: Secondary | ICD-10-CM | POA: Diagnosis not present

## 2022-02-03 LAB — URINALYSIS, COMPLETE
Bilirubin, UA: NEGATIVE
Glucose, UA: NEGATIVE
Ketones, UA: NEGATIVE
Leukocytes,UA: NEGATIVE
Nitrite, UA: NEGATIVE
Protein,UA: NEGATIVE
Specific Gravity, UA: 1.015 (ref 1.005–1.030)
Urobilinogen, Ur: 0.2 mg/dL (ref 0.2–1.0)
pH, UA: 6 (ref 5.0–7.5)

## 2022-02-03 LAB — MICROSCOPIC EXAMINATION: Bacteria, UA: NONE SEEN

## 2022-02-03 MED ORDER — BCG LIVE 50 MG IS SUSR
3.2400 mL | Freq: Once | INTRAVESICAL | Status: AC
  Administered 2022-02-03: 81 mg via INTRAVESICAL

## 2022-02-03 NOTE — Progress Notes (Signed)
BCG Bladder Instillation  BCG # 1 of 6  Due to Bladder Cancer patient is present today for a BCG treatment. Patient was cleaned and prepped in a sterile fashion with betadine. A 14FR catheter was inserted, urine return was noted 17m, urine was yellow in color.  575mof reconstituted BCG was instilled into the bladder. The catheter was then removed. Patient tolerated well, no complications were noted  Performed by: SaDebroah LoopPA-C and CaElberta LeatherwoodCMA  Follow up/ Additional notes: 1 week for BCG #2 of 6

## 2022-02-10 ENCOUNTER — Ambulatory Visit (INDEPENDENT_AMBULATORY_CARE_PROVIDER_SITE_OTHER): Payer: BC Managed Care – PPO | Admitting: Physician Assistant

## 2022-02-10 DIAGNOSIS — C679 Malignant neoplasm of bladder, unspecified: Secondary | ICD-10-CM

## 2022-02-10 DIAGNOSIS — R310 Gross hematuria: Secondary | ICD-10-CM | POA: Diagnosis not present

## 2022-02-10 DIAGNOSIS — D494 Neoplasm of unspecified behavior of bladder: Secondary | ICD-10-CM

## 2022-02-10 MED ORDER — BCG LIVE 50 MG IS SUSR
3.2400 mL | Freq: Once | INTRAVESICAL | Status: AC
  Administered 2022-02-10: 81 mg via INTRAVESICAL

## 2022-02-10 NOTE — Progress Notes (Signed)
BCG Bladder Instillation  BCG # 2 of 6  Due to Bladder Cancer patient is present today for a BCG treatment. Patient was cleaned and prepped in a sterile fashion with betadine. A 14FR catheter was inserted, urine return was noted 45m, urine was yellow in color.  598mof reconstituted BCG was instilled into the bladder. The catheter was then removed. Patient tolerated well, no complications were noted  Performed by: SaDebroah LoopPA-C and TaMardelle MatteCMA  Follow up/ Additional notes: 1 week for BCG #3 of 6

## 2022-02-11 LAB — URINALYSIS, COMPLETE
Bilirubin, UA: NEGATIVE
Glucose, UA: NEGATIVE
Ketones, UA: NEGATIVE
Leukocytes,UA: NEGATIVE
Nitrite, UA: NEGATIVE
Protein,UA: NEGATIVE
RBC, UA: NEGATIVE
Specific Gravity, UA: 1.005 (ref 1.005–1.030)
Urobilinogen, Ur: 0.2 mg/dL (ref 0.2–1.0)
pH, UA: 5 (ref 5.0–7.5)

## 2022-02-11 LAB — MICROSCOPIC EXAMINATION: Bacteria, UA: NONE SEEN

## 2022-02-13 DIAGNOSIS — M25511 Pain in right shoulder: Secondary | ICD-10-CM | POA: Diagnosis not present

## 2022-02-13 DIAGNOSIS — M542 Cervicalgia: Secondary | ICD-10-CM | POA: Diagnosis not present

## 2022-02-17 ENCOUNTER — Encounter: Payer: Self-pay | Admitting: Physician Assistant

## 2022-02-17 ENCOUNTER — Ambulatory Visit (INDEPENDENT_AMBULATORY_CARE_PROVIDER_SITE_OTHER): Payer: BC Managed Care – PPO | Admitting: Physician Assistant

## 2022-02-17 DIAGNOSIS — C679 Malignant neoplasm of bladder, unspecified: Secondary | ICD-10-CM | POA: Diagnosis not present

## 2022-02-17 DIAGNOSIS — D494 Neoplasm of unspecified behavior of bladder: Secondary | ICD-10-CM

## 2022-02-17 DIAGNOSIS — R310 Gross hematuria: Secondary | ICD-10-CM

## 2022-02-17 MED ORDER — BCG LIVE 50 MG IS SUSR
3.2400 mL | Freq: Once | INTRAVESICAL | Status: AC
  Administered 2022-02-17: 81 mg via INTRAVESICAL

## 2022-02-17 NOTE — Progress Notes (Signed)
BCG Bladder Instillation  BCG # 3 of 6  Due to Bladder Cancer patient is present today for a BCG treatment. Patient was cleaned and prepped in a sterile fashion with betadine. A 14FR catheter was inserted, urine return was noted 4m, urine was yellow in color.  51mof reconstituted BCG was instilled into the bladder. The catheter was then removed. Patient tolerated well, no complications were noted  Performed by: SaDebroah LoopPA-C and CaElberta LeatherwoodCMA  Follow up/ Additional notes: 1 week for BCG #4 of 6

## 2022-02-18 LAB — MICROSCOPIC EXAMINATION: Bacteria, UA: NONE SEEN

## 2022-02-18 LAB — URINALYSIS, COMPLETE
Bilirubin, UA: NEGATIVE
Glucose, UA: NEGATIVE
Ketones, UA: NEGATIVE
Leukocytes,UA: NEGATIVE
Nitrite, UA: NEGATIVE
Protein,UA: NEGATIVE
RBC, UA: NEGATIVE
Specific Gravity, UA: 1.01 (ref 1.005–1.030)
Urobilinogen, Ur: 0.2 mg/dL (ref 0.2–1.0)
pH, UA: 6.5 (ref 5.0–7.5)

## 2022-02-21 IMAGING — US US RENAL
1 series · 14 of 25 positions shown · non-contrast
Comparison: None.

CLINICAL DATA: Stage 3 chronic kidney disease

EXAM:
RENAL / URINARY TRACT ULTRASOUND COMPLETE

[Series 1: us renal · 0.26mm/px · 14 of 36 slices shown]
[im 1/36]
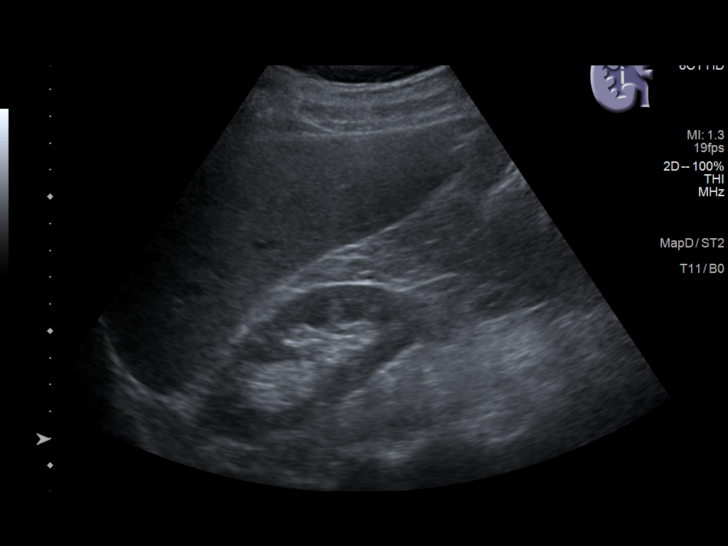
[im 3/36]
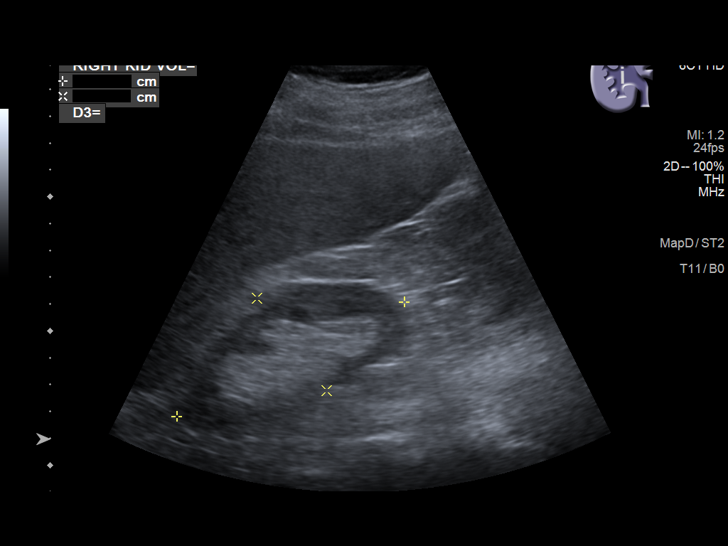
[im 6/36]
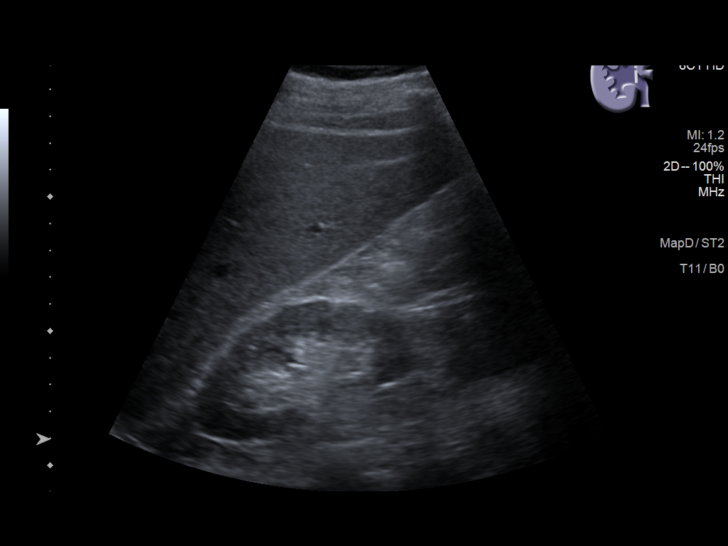
[im 9/36]
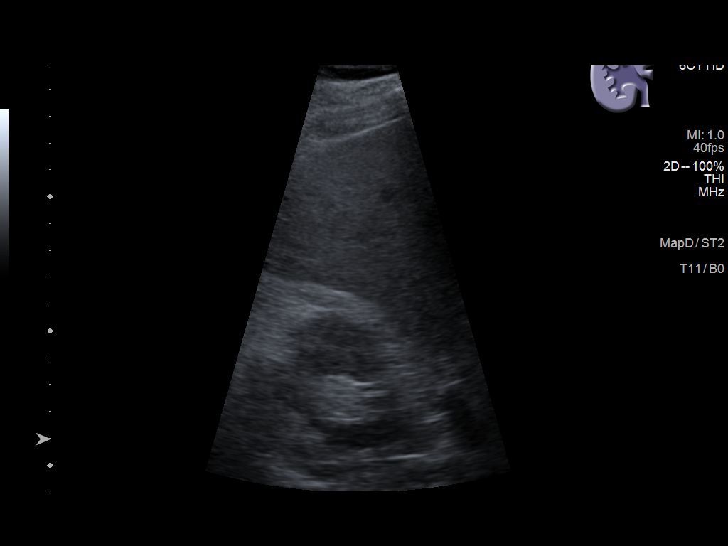
[im 12/36]
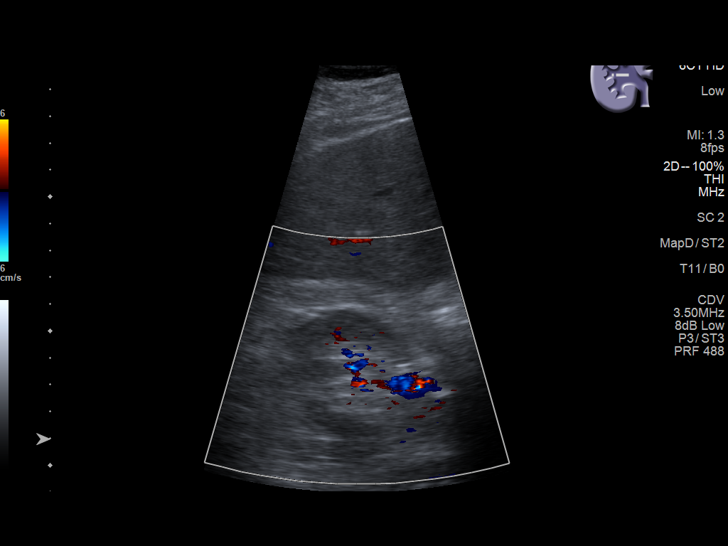
[im 14/36]
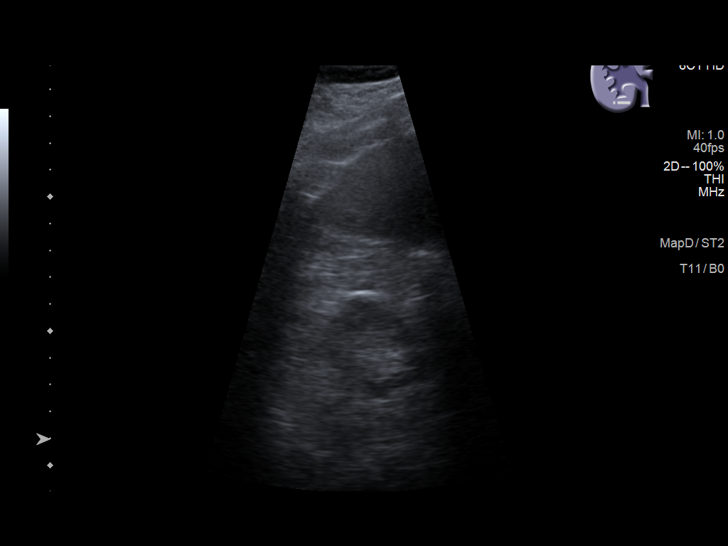
[im 17/36]
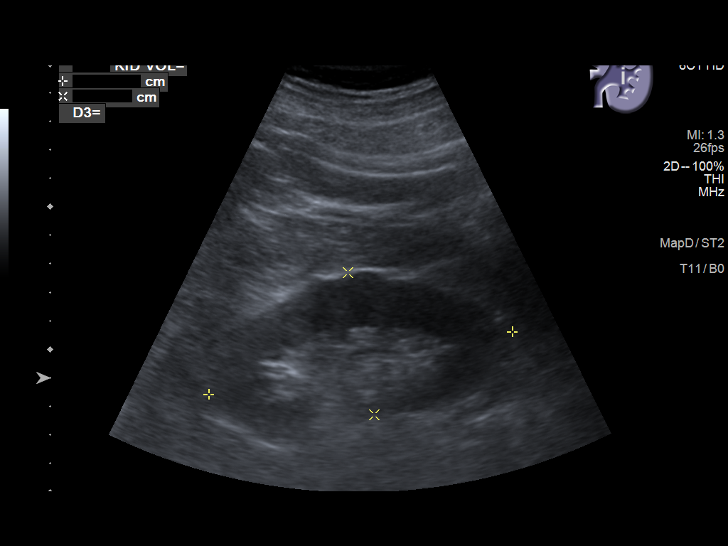
[im 19/36]
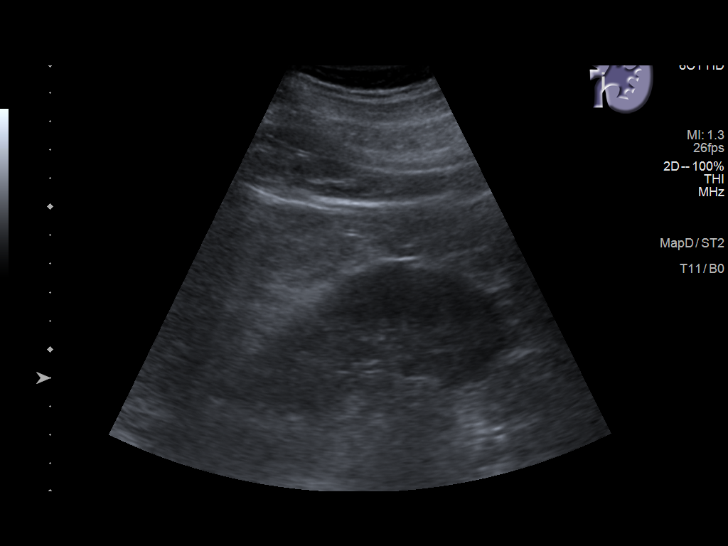
[im 22/36]
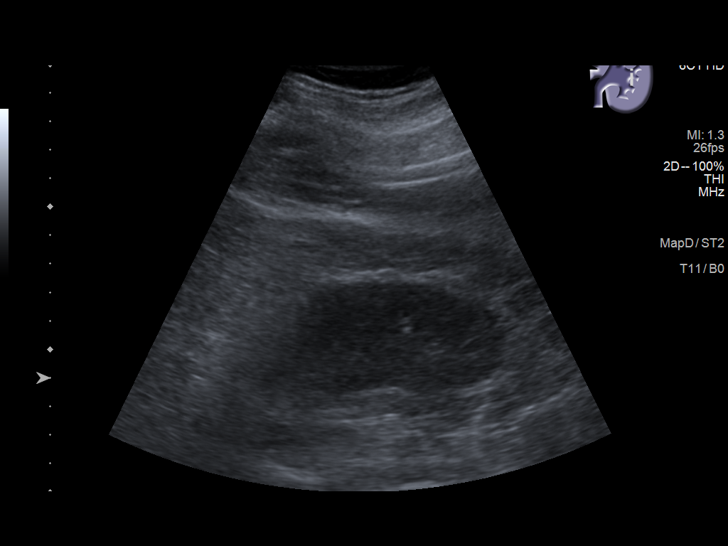
[im 24/36]
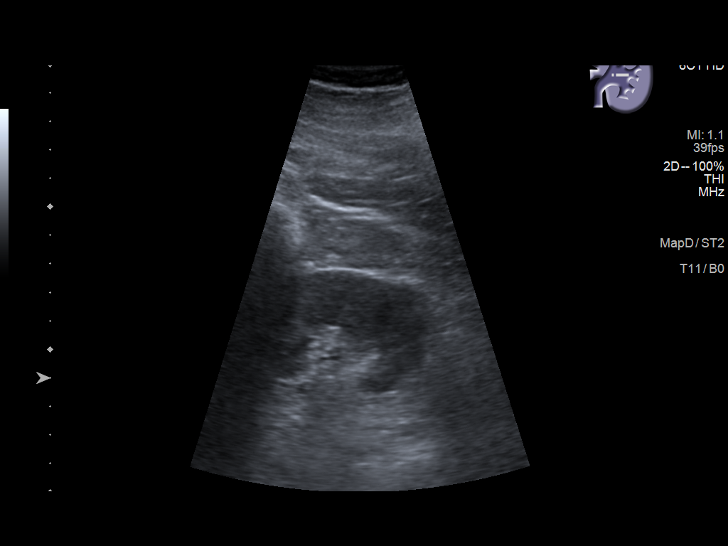
[im 27/36]
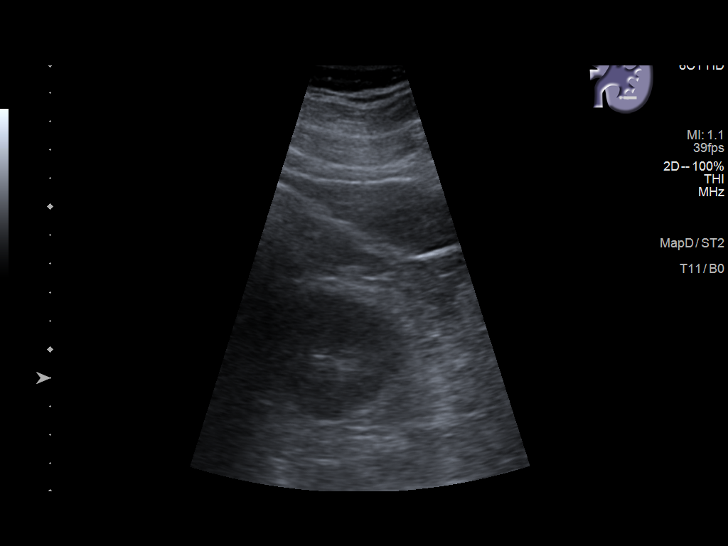
[im 30/36]
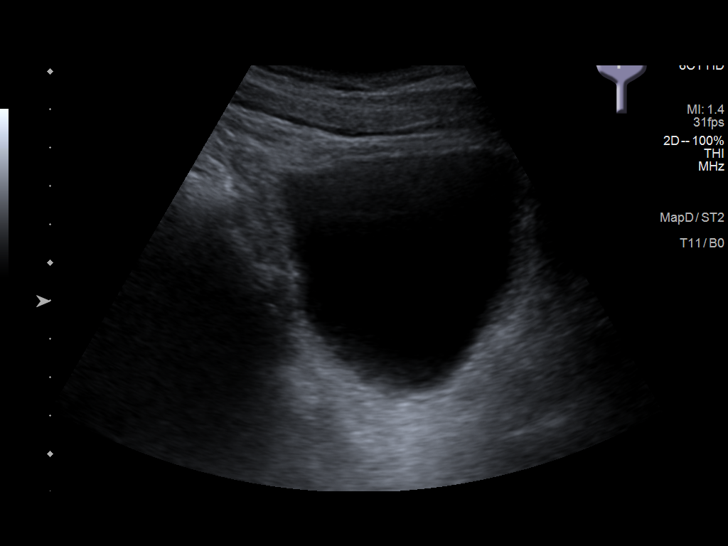
[im 33/36]
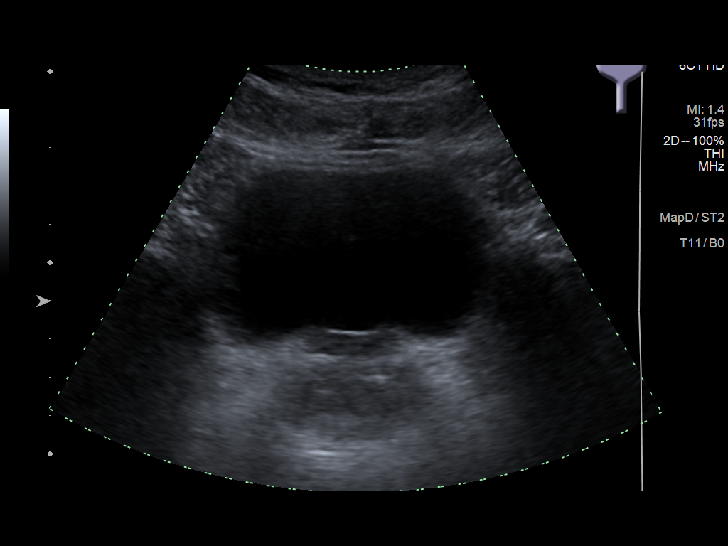
[im 36/36]
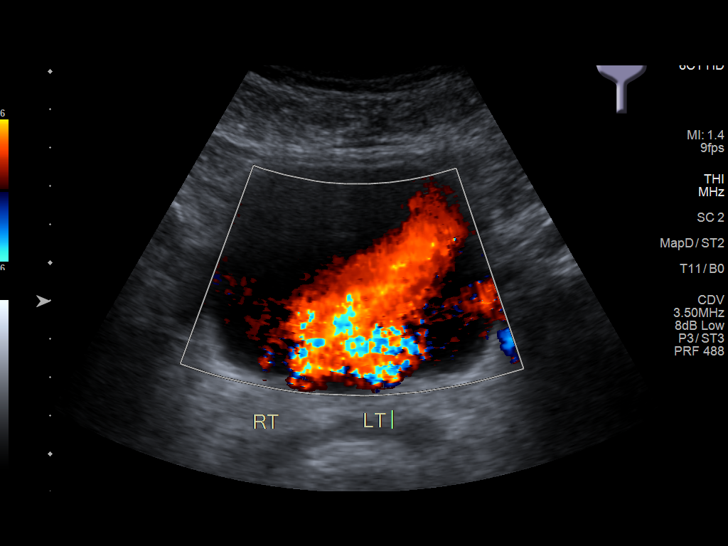

[14 of 25 positions shown; findings below may reference images not displayed]

FINDINGS: Right Kidney:

Renal measurements: 9.5 x 4.3 x 4.5 cm = volume: 95.3 mL.
Echogenicity within normal limits. No mass or hydronephrosis
visualized.

Left Kidney:

Renal measurements: 10.9 x 5.1 x 4.1 cm = volume: 118.2 mL.
Echogenicity within normal limits. No mass or hydronephrosis
visualized.

Bladder:

Appears normal for degree of bladder distention.

Other:

None.
IMPRESSION: Negative examination

## 2022-02-24 ENCOUNTER — Ambulatory Visit (INDEPENDENT_AMBULATORY_CARE_PROVIDER_SITE_OTHER): Payer: BC Managed Care – PPO | Admitting: Physician Assistant

## 2022-02-24 DIAGNOSIS — C679 Malignant neoplasm of bladder, unspecified: Secondary | ICD-10-CM

## 2022-02-24 DIAGNOSIS — D494 Neoplasm of unspecified behavior of bladder: Secondary | ICD-10-CM

## 2022-02-24 LAB — URINALYSIS, COMPLETE
Bilirubin, UA: NEGATIVE
Glucose, UA: NEGATIVE
Ketones, UA: NEGATIVE
Leukocytes,UA: NEGATIVE
Nitrite, UA: NEGATIVE
Protein,UA: NEGATIVE
RBC, UA: NEGATIVE
Specific Gravity, UA: 1.01 (ref 1.005–1.030)
Urobilinogen, Ur: 0.2 mg/dL (ref 0.2–1.0)
pH, UA: 6 (ref 5.0–7.5)

## 2022-02-24 LAB — MICROSCOPIC EXAMINATION
Bacteria, UA: NONE SEEN
RBC, Urine: NONE SEEN /hpf (ref 0–2)

## 2022-02-24 MED ORDER — BCG LIVE 50 MG IS SUSR
3.2400 mL | Freq: Once | INTRAVESICAL | Status: AC
  Administered 2022-02-24: 81 mg via INTRAVESICAL

## 2022-02-24 NOTE — Progress Notes (Signed)
BCG Bladder Instillation  BCG # 4 of 6  Due to Bladder Cancer patient is present today for a BCG treatment. Patient was cleaned and prepped in a sterile fashion with betadine. A 14FR catheter was inserted, urine return was noted 59m, urine was yellow in color.  53mof reconstituted BCG was instilled into the bladder. The catheter was then removed. Patient tolerated well, no complications were noted  Performed by: SaDebroah LoopPA-C and JeGaspar ColaCMA  Follow up/ Additional notes: 1 week for BCG #5 of 6

## 2022-03-03 ENCOUNTER — Encounter: Payer: Self-pay | Admitting: Physician Assistant

## 2022-03-03 ENCOUNTER — Ambulatory Visit (INDEPENDENT_AMBULATORY_CARE_PROVIDER_SITE_OTHER): Payer: BC Managed Care – PPO | Admitting: Physician Assistant

## 2022-03-03 DIAGNOSIS — C679 Malignant neoplasm of bladder, unspecified: Secondary | ICD-10-CM

## 2022-03-03 DIAGNOSIS — D494 Neoplasm of unspecified behavior of bladder: Secondary | ICD-10-CM

## 2022-03-03 LAB — MICROSCOPIC EXAMINATION: Bacteria, UA: NONE SEEN

## 2022-03-03 LAB — URINALYSIS, COMPLETE
Bilirubin, UA: NEGATIVE
Glucose, UA: NEGATIVE
Ketones, UA: NEGATIVE
Leukocytes,UA: NEGATIVE
Nitrite, UA: NEGATIVE
Protein,UA: NEGATIVE
RBC, UA: NEGATIVE
Specific Gravity, UA: 1.01 (ref 1.005–1.030)
Urobilinogen, Ur: 0.2 mg/dL (ref 0.2–1.0)
pH, UA: 6 (ref 5.0–7.5)

## 2022-03-03 MED ORDER — BCG LIVE 50 MG IS SUSR
3.2400 mL | Freq: Once | INTRAVESICAL | Status: AC
  Administered 2022-03-03: 81 mg via INTRAVESICAL

## 2022-03-03 NOTE — Progress Notes (Signed)
BCG Bladder Instillation  BCG # 5 of 6  Due to Bladder Cancer patient is present today for a BCG treatment. Patient was cleaned and prepped in a sterile fashion with betadine. A 14FR catheter was inserted, urine return was noted 25m, urine was yellow in color.  545mof reconstituted BCG was instilled into the bladder. The catheter was then removed. Patient tolerated well, no complications were noted  Performed by: SaDebroah LoopPA-C and DeKenmarCMA  Follow up/ Additional notes: 1 week for BCG #6 of 6

## 2022-03-10 ENCOUNTER — Ambulatory Visit: Payer: BC Managed Care – PPO | Admitting: Physician Assistant

## 2022-03-11 ENCOUNTER — Ambulatory Visit (INDEPENDENT_AMBULATORY_CARE_PROVIDER_SITE_OTHER): Payer: BC Managed Care – PPO | Admitting: Physician Assistant

## 2022-03-11 ENCOUNTER — Encounter: Payer: Self-pay | Admitting: Physician Assistant

## 2022-03-11 DIAGNOSIS — C672 Malignant neoplasm of lateral wall of bladder: Secondary | ICD-10-CM | POA: Diagnosis not present

## 2022-03-11 DIAGNOSIS — R310 Gross hematuria: Secondary | ICD-10-CM

## 2022-03-11 LAB — MICROSCOPIC EXAMINATION: Bacteria, UA: NONE SEEN

## 2022-03-11 LAB — URINALYSIS, COMPLETE
Bilirubin, UA: NEGATIVE
Glucose, UA: NEGATIVE
Ketones, UA: NEGATIVE
Leukocytes,UA: NEGATIVE
Nitrite, UA: NEGATIVE
Protein,UA: NEGATIVE
RBC, UA: NEGATIVE
Specific Gravity, UA: 1.01 (ref 1.005–1.030)
Urobilinogen, Ur: 0.2 mg/dL (ref 0.2–1.0)
pH, UA: 6.5 (ref 5.0–7.5)

## 2022-03-11 MED ORDER — BCG LIVE 50 MG IS SUSR
3.2400 mL | Freq: Once | INTRAVESICAL | Status: AC
  Administered 2022-03-11: 81 mg via INTRAVESICAL

## 2022-03-11 NOTE — Progress Notes (Signed)
   Established Patient Office Visit  Subjective   Patient ID: Jonathan Small, male    DOB: 1960-04-21  Age: 61 y.o. MRN: [id number]  Chief Complaint  Patient presents with   BCG    HPI    ROS    Objective:     There were no vitals taken for this visit.   Physical Exam   No results found for any visits on 03/11/22.    The ASCVD Risk score (Arnett DK, et al., 2019) failed to calculate for the following reasons:   Cannot find a previous HDL lab   Cannot find a previous total cholesterol lab    Assessment & Plan:   Problem List Items Addressed This Visit   None Visit Diagnoses     Malignant neoplasm of lateral wall of urinary bladder (HCC)    -  Primary   Relevant Medications   bcg vaccine injection 81 mg   Other Relevant Orders   Urinalysis, Complete   Gross hematuria       Relevant Orders   Urinalysis, Complete       Return in about 3 months (around 06/10/2022) for Cysto with Dr. Erlene Quan.    Kyra Manges, CMA

## 2022-03-11 NOTE — Progress Notes (Signed)
BCG Bladder Instillation  BCG # 6 of 6  Due to Bladder Cancer patient is present today for a BCG treatment. Patient was cleaned and prepped in a sterile fashion with betadine. A 14FR catheter was inserted, urine return was noted 45m, urine was yellow in color.  580mof reconstituted BCG was instilled into the bladder. The catheter was then removed. Patient tolerated well, no complications were noted  Performed by: SaDebroah LoopPA-C and CaElberta LeatherwoodCMA  Follow up/ Additional notes: 3 month cysto with Dr. BrErlene Quan

## 2022-03-13 DIAGNOSIS — M25511 Pain in right shoulder: Secondary | ICD-10-CM | POA: Diagnosis not present

## 2022-03-13 DIAGNOSIS — M542 Cervicalgia: Secondary | ICD-10-CM | POA: Diagnosis not present

## 2022-03-15 DIAGNOSIS — N186 End stage renal disease: Secondary | ICD-10-CM | POA: Diagnosis not present

## 2022-03-15 DIAGNOSIS — Z992 Dependence on renal dialysis: Secondary | ICD-10-CM | POA: Diagnosis not present

## 2022-03-18 DIAGNOSIS — E0590 Thyrotoxicosis, unspecified without thyrotoxic crisis or storm: Secondary | ICD-10-CM | POA: Diagnosis not present

## 2022-03-18 DIAGNOSIS — E782 Mixed hyperlipidemia: Secondary | ICD-10-CM | POA: Diagnosis not present

## 2022-03-18 DIAGNOSIS — Z125 Encounter for screening for malignant neoplasm of prostate: Secondary | ICD-10-CM | POA: Diagnosis not present

## 2022-03-18 DIAGNOSIS — I10 Essential (primary) hypertension: Secondary | ICD-10-CM | POA: Diagnosis not present

## 2022-03-18 DIAGNOSIS — Z13220 Encounter for screening for lipoid disorders: Secondary | ICD-10-CM | POA: Diagnosis not present

## 2022-03-18 DIAGNOSIS — B009 Herpesviral infection, unspecified: Secondary | ICD-10-CM | POA: Diagnosis not present

## 2022-03-18 DIAGNOSIS — Z0000 Encounter for general adult medical examination without abnormal findings: Secondary | ICD-10-CM | POA: Diagnosis not present

## 2022-04-01 DIAGNOSIS — M1711 Unilateral primary osteoarthritis, right knee: Secondary | ICD-10-CM | POA: Diagnosis not present

## 2022-04-01 DIAGNOSIS — R8289 Other abnormal findings on cytological and histological examination of urine: Secondary | ICD-10-CM | POA: Diagnosis not present

## 2022-04-01 DIAGNOSIS — M25561 Pain in right knee: Secondary | ICD-10-CM | POA: Diagnosis not present

## 2022-04-01 DIAGNOSIS — C672 Malignant neoplasm of lateral wall of bladder: Secondary | ICD-10-CM | POA: Diagnosis not present

## 2022-04-01 DIAGNOSIS — C671 Malignant neoplasm of dome of bladder: Secondary | ICD-10-CM | POA: Diagnosis not present

## 2022-04-01 DIAGNOSIS — Z01812 Encounter for preprocedural laboratory examination: Secondary | ICD-10-CM | POA: Diagnosis not present

## 2022-04-15 DIAGNOSIS — N186 End stage renal disease: Secondary | ICD-10-CM | POA: Diagnosis not present

## 2022-04-15 DIAGNOSIS — Z992 Dependence on renal dialysis: Secondary | ICD-10-CM | POA: Diagnosis not present

## 2022-04-23 DIAGNOSIS — M1711 Unilateral primary osteoarthritis, right knee: Secondary | ICD-10-CM | POA: Diagnosis not present

## 2022-05-14 DIAGNOSIS — C679 Malignant neoplasm of bladder, unspecified: Secondary | ICD-10-CM | POA: Diagnosis not present

## 2022-06-03 DIAGNOSIS — Z86010 Personal history of colonic polyps: Secondary | ICD-10-CM | POA: Diagnosis not present

## 2022-06-03 DIAGNOSIS — D124 Benign neoplasm of descending colon: Secondary | ICD-10-CM | POA: Diagnosis not present

## 2022-06-03 DIAGNOSIS — K648 Other hemorrhoids: Secondary | ICD-10-CM | POA: Diagnosis not present

## 2022-06-03 DIAGNOSIS — Z09 Encounter for follow-up examination after completed treatment for conditions other than malignant neoplasm: Secondary | ICD-10-CM | POA: Diagnosis not present

## 2022-06-03 DIAGNOSIS — K5730 Diverticulosis of large intestine without perforation or abscess without bleeding: Secondary | ICD-10-CM | POA: Diagnosis not present

## 2022-06-16 ENCOUNTER — Other Ambulatory Visit: Payer: BC Managed Care – PPO | Admitting: Urology

## 2022-06-18 ENCOUNTER — Other Ambulatory Visit: Payer: BC Managed Care – PPO | Admitting: Urology

## 2022-06-18 ENCOUNTER — Ambulatory Visit (INDEPENDENT_AMBULATORY_CARE_PROVIDER_SITE_OTHER): Payer: BC Managed Care – PPO | Admitting: Urology

## 2022-06-18 VITALS — BP 161/94 | HR 84 | Ht 68.0 in | Wt 174.2 lb

## 2022-06-18 DIAGNOSIS — Z8551 Personal history of malignant neoplasm of bladder: Secondary | ICD-10-CM | POA: Diagnosis not present

## 2022-06-18 DIAGNOSIS — C672 Malignant neoplasm of lateral wall of bladder: Secondary | ICD-10-CM

## 2022-06-18 LAB — URINALYSIS, COMPLETE
Bilirubin, UA: NEGATIVE
Glucose, UA: NEGATIVE
Ketones, UA: NEGATIVE
Leukocytes,UA: NEGATIVE
Nitrite, UA: NEGATIVE
Protein,UA: NEGATIVE
RBC, UA: NEGATIVE
Specific Gravity, UA: 1.02 (ref 1.005–1.030)
Urobilinogen, Ur: 0.2 mg/dL (ref 0.2–1.0)
pH, UA: 6 (ref 5.0–7.5)

## 2022-06-18 LAB — MICROSCOPIC EXAMINATION: Bacteria, UA: NONE SEEN

## 2022-06-18 NOTE — Progress Notes (Signed)
   06/18/22 CC:  Chief Complaint  Patient presents with   Cysto     HPI: Jonathan Small is a 62 y.o. male with a personal history of bladder cancer, who presents today for a surveillance cystoscopy.   He is s/p TURBT on 11/2019 after which revealed incidental bladder tumor. 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 Upper tract imaging showed unremarkable.   He had a repeat cysto on 12/03/2020 that showed fine papillary changes at the 5 o'clock and 3 o'clock concerning for a satellite tumors, < 5 mm each.   He returned to the OR for repeat TURBT on 12/30/2020. Intraoperative findings: Carpeting-like papillary tumor just above the right UO at the 7 o'clock position of his previous TURBT stellate scar.  This measured approximately 1 cm and did not involve the right UO but in close proximity.  There is also some erythema at the 1 o'clock position of a stellate scar concerning for possible underlying CIS which was biopsied.  No other bladder bladder pathology identified.  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  He elected to undergo induction BCG x6. He received his last BCG on 03/25/2021.  Cysto 10/23 concerning for recurrence.  In the OR on 12/22/21 on papillary carpeting at the left dome of the bladder most consistent with early recurrence versus CIS.  There is also an area of erythema just beyond the left UO on the lateral wall concerning for underlying pathology and as such this was also biopsied.  Bilateral retrograde pyelogram normal.   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  Most recently, he completed a reinduction BCG x 6.  Since last visit, he is also seeing [facility] as a second opinion.    Vitals:   06/18/22 1254 06/18/22 1327  BP: (!) 156/90 (!) 161/94  Pulse: 85 84   NED. A&Ox3.   No respiratory distress   Abd soft,  NT, ND Normal phallus with bilateral descended testicles  Cystoscopy Procedure Note  Patient identification was confirmed, informed consent was obtained, and patient was prepped using Betadine solution.  Lidocaine jelly was administered per urethral meatus.     Pre-Procedure: - Inspection reveals a normal caliber ureteral meatus.  Procedure: The flexible cystoscope was introduced without difficulty - No urethral strictures/lesions are present. - Normal prostate  - Normal bladder neck - Bilateral ureteral orifices identified - Bladder mucosa  reveals no ulcers, tumors, or lesions - No bladder stones - No trabeculation Retroflexion shows unremarkable    Post-Procedure: - Patient tolerated the procedure well   Assessment/ Plan:  Bladder mass/ erythema -No evidence of disease after recent reinduction of BCG which is reassuring -urine cytology today -Preop UCx   F/u 3 mo for cysto  Hollice Espy, MD

## 2022-06-22 LAB — CYTOLOGY - NON PAP

## 2022-07-29 DIAGNOSIS — E0590 Thyrotoxicosis, unspecified without thyrotoxic crisis or storm: Secondary | ICD-10-CM | POA: Diagnosis not present

## 2022-07-29 DIAGNOSIS — E785 Hyperlipidemia, unspecified: Secondary | ICD-10-CM | POA: Diagnosis not present

## 2022-07-29 DIAGNOSIS — R944 Abnormal results of kidney function studies: Secondary | ICD-10-CM | POA: Diagnosis not present

## 2022-07-29 DIAGNOSIS — N1831 Chronic kidney disease, stage 3a: Secondary | ICD-10-CM | POA: Diagnosis not present

## 2022-08-05 DIAGNOSIS — N1831 Chronic kidney disease, stage 3a: Secondary | ICD-10-CM | POA: Diagnosis not present

## 2022-08-05 DIAGNOSIS — I10 Essential (primary) hypertension: Secondary | ICD-10-CM | POA: Diagnosis not present

## 2022-08-05 DIAGNOSIS — E785 Hyperlipidemia, unspecified: Secondary | ICD-10-CM | POA: Diagnosis not present

## 2022-08-05 DIAGNOSIS — R944 Abnormal results of kidney function studies: Secondary | ICD-10-CM | POA: Diagnosis not present

## 2022-08-05 DIAGNOSIS — M1711 Unilateral primary osteoarthritis, right knee: Secondary | ICD-10-CM | POA: Diagnosis not present

## 2022-09-16 ENCOUNTER — Other Ambulatory Visit: Payer: BC Managed Care – PPO | Admitting: Urology

## 2022-10-05 DIAGNOSIS — M1711 Unilateral primary osteoarthritis, right knee: Secondary | ICD-10-CM | POA: Diagnosis not present

## 2022-10-14 ENCOUNTER — Ambulatory Visit: Payer: BC Managed Care – PPO | Admitting: Urology

## 2022-10-14 VITALS — BP 158/88 | HR 74 | Ht 68.0 in | Wt 174.0 lb

## 2022-10-14 DIAGNOSIS — C672 Malignant neoplasm of lateral wall of bladder: Secondary | ICD-10-CM

## 2022-10-14 DIAGNOSIS — N3289 Other specified disorders of bladder: Secondary | ICD-10-CM | POA: Diagnosis not present

## 2022-10-14 DIAGNOSIS — Z8551 Personal history of malignant neoplasm of bladder: Secondary | ICD-10-CM

## 2022-10-14 DIAGNOSIS — R310 Gross hematuria: Secondary | ICD-10-CM

## 2022-10-14 LAB — URINALYSIS, COMPLETE
Bilirubin, UA: NEGATIVE
Glucose, UA: NEGATIVE
Ketones, UA: NEGATIVE
Leukocytes,UA: NEGATIVE
Nitrite, UA: NEGATIVE
Protein,UA: NEGATIVE
RBC, UA: NEGATIVE
Specific Gravity, UA: 1.015 (ref 1.005–1.030)
Urobilinogen, Ur: 0.2 mg/dL (ref 0.2–1.0)
pH, UA: 6 (ref 5.0–7.5)

## 2022-10-14 LAB — MICROSCOPIC EXAMINATION
Bacteria, UA: NONE SEEN
RBC, Urine: NONE SEEN /hpf (ref 0–2)

## 2022-10-14 NOTE — Progress Notes (Unsigned)
   10/14/22  CC:  Chief Complaint  Patient presents with   Cysto     HPI: Jonathan Small is a 62 y.o. male with a personal history of bladder cancer, who presents today for a surveillance cystoscopy.   He is s/p TURBT on 11/2019 after which revealed incidental bladder tumor. 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 Upper tract imaging showed unremarkable.   He had a repeat cysto on 12/03/2020 that showed fine papillary changes at the 5 o'clock and 3 o'clock concerning for a satellite tumors, < 5 mm each.   He returned to the OR for repeat TURBT on 12/30/2020. Intraoperative findings: Carpeting-like papillary tumor just above the right UO at the 7 o'clock position of his previous TURBT stellate scar.  This measured approximately 1 cm and did not involve the right UO but in close proximity.  There is also some erythema at the 1 o'clock position of a stellate scar concerning for possible underlying CIS which was biopsied.  No other bladder bladder pathology identified.  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  He elected to undergo induction BCG x6. He received his last BCG on 03/25/2021.  Cysto 10/23 concerning for recurrence.  In the OR on 12/22/21 on papillary carpeting at the left dome of the bladder most consistent with early recurrence versus CIS.  There is also an area of erythema just beyond the left UO on the lateral wall concerning for underlying pathology and as such this was also biopsied.  Bilateral retrograde pyelogram normal.   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  Most recently, he completed a reinduction BCG x 6 on 02/2022.    Vitals:   10/14/22 1544  BP: (!) 158/88  Pulse: 74   NED. A&Ox3.   No respiratory distress   Abd soft, NT, ND Normal phallus with bilateral descended testicles  Cystoscopy Procedure  Note  Patient identification was confirmed, informed consent was obtained, and patient was prepped using Betadine solution.  Lidocaine jelly was administered per urethral meatus.     Pre-Procedure: - Inspection reveals a normal caliber ureteral meatus.  Procedure: The flexible cystoscope was introduced without difficulty - No urethral strictures/lesions are present. - Normal prostate  - Normal bladder neck - Bilateral ureteral orifices identified - Bladder mucosa  reveals no ulcers, tumors, or lesions.  Stellate scars appreciated.   - No bladder stones - No trabeculation Retroflexion shows unremarkable    Post-Procedure: - Patient tolerated the procedure well   Assessment/ Plan:  Bladder mass/ erythema -Discussed consideration of maintenance BCG today in light of his previous BCG failure, encouraged him to strongly consider this; he would like to proceed with this -Cystoscopy again in 4 months from now after BCG x 3 complete -NED of disease today   F/u 4 mo for cysto  Vanna Scotland, MD

## 2022-10-29 DIAGNOSIS — R42 Dizziness and giddiness: Secondary | ICD-10-CM | POA: Diagnosis not present

## 2022-10-29 DIAGNOSIS — I10 Essential (primary) hypertension: Secondary | ICD-10-CM | POA: Diagnosis not present

## 2022-10-29 DIAGNOSIS — R110 Nausea: Secondary | ICD-10-CM | POA: Diagnosis not present

## 2022-10-29 DIAGNOSIS — N183 Chronic kidney disease, stage 3 unspecified: Secondary | ICD-10-CM | POA: Diagnosis not present

## 2022-11-02 NOTE — Progress Notes (Signed)
BCG Bladder Instillation  BCG # 1/3  Due to Bladder Cancer patient is present today for a BCG treatment. Patient was cleaned and prepped in a sterile fashion with betadine. A 14 FR catheter was inserted, urine return was noted 50 ml, urine was yellow in color.  50ml of reconstituted BCG was instilled into the bladder. The catheter was then removed. Patient tolerated well, no complications were noted.  He mentioned that he has a difficult time holding the medication in for the two hours, so we have given him samples of Gemtesa 75 mg, # 28 to take daily.   Performed by: Michiel Cowboy, PA-C and Humberta Magallon-Mariche, CMA  Follow up/ Additional notes: Next week for # 2/3 BCG

## 2022-11-03 ENCOUNTER — Ambulatory Visit (INDEPENDENT_AMBULATORY_CARE_PROVIDER_SITE_OTHER): Payer: BC Managed Care – PPO | Admitting: Urology

## 2022-11-03 ENCOUNTER — Encounter: Payer: Self-pay | Admitting: Urology

## 2022-11-03 VITALS — BP 138/88 | HR 66 | Ht 68.0 in | Wt 174.0 lb

## 2022-11-03 DIAGNOSIS — C672 Malignant neoplasm of lateral wall of bladder: Secondary | ICD-10-CM

## 2022-11-03 DIAGNOSIS — N3289 Other specified disorders of bladder: Secondary | ICD-10-CM

## 2022-11-03 MED ORDER — BCG LIVE 50 MG IS SUSR
3.2400 mL | Freq: Once | INTRAVESICAL | Status: AC
  Administered 2022-11-03: 81 mg via INTRAVESICAL

## 2022-11-03 MED ORDER — GEMTESA 75 MG PO TABS: 75.0000 mg | ORAL_TABLET | Freq: Every day | ORAL | Status: DC

## 2022-11-04 LAB — URINALYSIS, COMPLETE
Bilirubin, UA: NEGATIVE
Glucose, UA: NEGATIVE
Ketones, UA: NEGATIVE
Leukocytes,UA: NEGATIVE
Nitrite, UA: NEGATIVE
Protein,UA: NEGATIVE
RBC, UA: NEGATIVE
Specific Gravity, UA: 1.025 (ref 1.005–1.030)
Urobilinogen, Ur: 0.2 mg/dL (ref 0.2–1.0)
pH, UA: 6 (ref 5.0–7.5)

## 2022-11-04 LAB — MICROSCOPIC EXAMINATION: Bacteria, UA: NONE SEEN

## 2022-11-10 ENCOUNTER — Encounter: Payer: Self-pay | Admitting: Physician Assistant

## 2022-11-10 ENCOUNTER — Ambulatory Visit (INDEPENDENT_AMBULATORY_CARE_PROVIDER_SITE_OTHER): Payer: BC Managed Care – PPO | Admitting: Physician Assistant

## 2022-11-10 DIAGNOSIS — C679 Malignant neoplasm of bladder, unspecified: Secondary | ICD-10-CM

## 2022-11-10 DIAGNOSIS — D494 Neoplasm of unspecified behavior of bladder: Secondary | ICD-10-CM

## 2022-11-10 DIAGNOSIS — N3289 Other specified disorders of bladder: Secondary | ICD-10-CM

## 2022-11-10 LAB — URINALYSIS, COMPLETE
Bilirubin, UA: NEGATIVE
Glucose, UA: NEGATIVE
Ketones, UA: NEGATIVE
Leukocytes,UA: NEGATIVE
Nitrite, UA: NEGATIVE
Protein,UA: NEGATIVE
RBC, UA: NEGATIVE
Specific Gravity, UA: <1.005 — ABNORMAL LOW (ref 1.005–1.030)
Urobilinogen, Ur: 0.2 mg/dL (ref 0.2–1.0)
pH, UA: 5.5 (ref 5.0–7.5)

## 2022-11-10 LAB — MICROSCOPIC EXAMINATION
Bacteria, UA: NONE SEEN
RBC, Urine: NONE SEEN /hpf (ref 0–2)

## 2022-11-10 MED ORDER — BCG LIVE 50 MG IS SUSR
3.2400 mL | Freq: Once | INTRAVESICAL | Status: AC
  Administered 2022-11-10: 81 mg via INTRAVESICAL

## 2022-11-10 NOTE — Progress Notes (Signed)
BCG Bladder Instillation  BCG # 2 of 3  Due to Bladder Cancer patient is present today for a BCG treatment. Patient was cleaned and prepped in a sterile fashion with betadine. A 14FR catheter was inserted, urine return was noted 10ml, urine was yellow in color.  50ml of reconstituted BCG was instilled into the bladder. The catheter was then removed. Patient tolerated well, no complications were noted  Performed by: Carman Ching, PA-C and Ples Specter, CMA  Follow up/ Additional notes: 1 week for BCG #3

## 2022-11-17 ENCOUNTER — Encounter: Payer: Self-pay | Admitting: Physician Assistant

## 2022-11-17 ENCOUNTER — Ambulatory Visit (INDEPENDENT_AMBULATORY_CARE_PROVIDER_SITE_OTHER): Payer: BC Managed Care – PPO | Admitting: Physician Assistant

## 2022-11-17 DIAGNOSIS — D494 Neoplasm of unspecified behavior of bladder: Secondary | ICD-10-CM

## 2022-11-17 DIAGNOSIS — C679 Malignant neoplasm of bladder, unspecified: Secondary | ICD-10-CM | POA: Diagnosis not present

## 2022-11-17 LAB — MICROSCOPIC EXAMINATION: Bacteria, UA: NONE SEEN

## 2022-11-17 LAB — URINALYSIS, COMPLETE
Bilirubin, UA: NEGATIVE
Glucose, UA: NEGATIVE
Ketones, UA: NEGATIVE
Leukocytes,UA: NEGATIVE
Nitrite, UA: NEGATIVE
Protein,UA: NEGATIVE
RBC, UA: NEGATIVE
Specific Gravity, UA: 1.01 (ref 1.005–1.030)
Urobilinogen, Ur: 0.2 mg/dL (ref 0.2–1.0)
pH, UA: 6 (ref 5.0–7.5)

## 2022-11-17 MED ORDER — BCG LIVE 50 MG IS SUSR
3.2400 mL | Freq: Once | INTRAVESICAL | Status: AC
Start: 2022-11-17 — End: 2022-11-17
  Administered 2022-11-17: 81 mg via INTRAVESICAL

## 2022-11-17 NOTE — Progress Notes (Signed)
BCG Bladder Instillation  BCG # 3 of 3  Due to Bladder Cancer patient is present today for a BCG treatment. Patient was cleaned and prepped in a sterile fashion with betadine. A 14FR catheter was inserted, urine return was noted 5ml, urine was yellow in color.  50ml of reconstituted BCG was instilled into the bladder. The catheter was then removed. Patient tolerated well, no complications were noted  Performed by: Carman Ching, PA-C and Ples Specter, CMA  Follow up: Return in about 3 months (around 02/16/2023) for Cysto with Dr. Apolinar Junes.

## 2022-11-23 DIAGNOSIS — X32XXXA Exposure to sunlight, initial encounter: Secondary | ICD-10-CM | POA: Diagnosis not present

## 2022-11-23 DIAGNOSIS — D2272 Melanocytic nevi of left lower limb, including hip: Secondary | ICD-10-CM | POA: Diagnosis not present

## 2022-11-23 DIAGNOSIS — D2262 Melanocytic nevi of left upper limb, including shoulder: Secondary | ICD-10-CM | POA: Diagnosis not present

## 2022-11-23 DIAGNOSIS — D2261 Melanocytic nevi of right upper limb, including shoulder: Secondary | ICD-10-CM | POA: Diagnosis not present

## 2022-11-23 DIAGNOSIS — D225 Melanocytic nevi of trunk: Secondary | ICD-10-CM | POA: Diagnosis not present

## 2022-11-23 DIAGNOSIS — L570 Actinic keratosis: Secondary | ICD-10-CM | POA: Diagnosis not present

## 2023-01-14 DIAGNOSIS — S161XXA Strain of muscle, fascia and tendon at neck level, initial encounter: Secondary | ICD-10-CM | POA: Diagnosis not present

## 2023-01-14 DIAGNOSIS — R42 Dizziness and giddiness: Secondary | ICD-10-CM | POA: Diagnosis not present

## 2023-01-14 DIAGNOSIS — I10 Essential (primary) hypertension: Secondary | ICD-10-CM | POA: Diagnosis not present

## 2023-01-15 ENCOUNTER — Other Ambulatory Visit: Payer: Self-pay | Admitting: Family Medicine

## 2023-01-15 DIAGNOSIS — R42 Dizziness and giddiness: Secondary | ICD-10-CM

## 2023-02-09 DIAGNOSIS — H6990 Unspecified Eustachian tube disorder, unspecified ear: Secondary | ICD-10-CM | POA: Diagnosis not present

## 2023-03-03 ENCOUNTER — Ambulatory Visit: Payer: BC Managed Care – PPO | Admitting: Urology

## 2023-03-03 VITALS — BP 116/70 | HR 80 | Ht 68.0 in | Wt 174.0 lb

## 2023-03-03 DIAGNOSIS — R310 Gross hematuria: Secondary | ICD-10-CM | POA: Diagnosis not present

## 2023-03-03 DIAGNOSIS — Z8551 Personal history of malignant neoplasm of bladder: Secondary | ICD-10-CM | POA: Diagnosis not present

## 2023-03-03 LAB — MICROSCOPIC EXAMINATION: Bacteria, UA: NONE SEEN

## 2023-03-03 LAB — URINALYSIS, COMPLETE
Bilirubin, UA: NEGATIVE
Glucose, UA: NEGATIVE
Ketones, UA: NEGATIVE
Leukocytes,UA: NEGATIVE
Nitrite, UA: NEGATIVE
Protein,UA: NEGATIVE
RBC, UA: NEGATIVE
Specific Gravity, UA: 1.025 (ref 1.005–1.030)
Urobilinogen, Ur: 0.2 mg/dL (ref 0.2–1.0)
pH, UA: 7 (ref 5.0–7.5)

## 2023-03-03 NOTE — Progress Notes (Signed)
   03/03/23  CC:  Chief Complaint  Patient presents with   Cysto     HPI: Jonathan Small is a 62 y.o. male with a personal history of bladder cancer, who presents today for a surveillance cystoscopy.   He is s/p TURBT on 11/2019 after which revealed incidental bladder tumor. 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 Upper tract imaging showed unremarkable.   He had a repeat cysto on 12/03/2020 that showed fine papillary changes at the 5 o'clock and 3 o'clock concerning for a satellite tumors, < 5 mm each.   He returned to the OR for repeat TURBT on 12/30/2020. Intraoperative findings: Carpeting-like papillary tumor just above the right UO at the 7 o'clock position of his previous TURBT stellate scar.  This measured approximately 1 cm and did not involve the right UO but in close proximity.  There is also some erythema at the 1 o'clock position of a stellate scar concerning for possible underlying CIS which was biopsied.  No other bladder bladder pathology identified.  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  He elected to undergo induction BCG x6. He received his last BCG on 03/25/2021.  Cysto 10/23 concerning for recurrence.  In the OR on 12/22/21 on papillary carpeting at the left dome of the bladder most consistent with early recurrence versus CIS.  There is also an area of erythema just beyond the left UO on the lateral wall concerning for underlying pathology and as such this was also biopsied.  Bilateral retrograde pyelogram normal.   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  Most recently, he completed a reinduction BCG x 6 on 02/2022.  More recently he completed BCG x 3.      Vitals:   03/03/23 1539  BP: 116/70  Pulse: 80   NED. A&Ox3.   No respiratory distress   Abd soft, NT, ND Normal phallus with bilateral descended  testicles  Cystoscopy Procedure Note  Patient identification was confirmed, informed consent was obtained, and patient was prepped using Betadine solution.  Lidocaine jelly was administered per urethral meatus.     Pre-Procedure: - Inspection reveals a normal caliber ureteral meatus.  Procedure: The flexible cystoscope was introduced without difficulty - No urethral strictures/lesions are present. - Normal prostate  - Normal bladder neck - Bilateral ureteral orifices identified - Bladder mucosa  reveals no ulcers, tumors, or lesions.  Stellate scars appreciated.   - No bladder stones - No trabeculation Retroflexion shows unremarkable    Post-Procedure: - Patient tolerated the procedure well   Assessment/ Plan:  History of bladder cancer -NED of disease today -Will follow him closely, repeat cystoscopy in 3 months including a cytology -Consider repeat upper tract imaging down the road given his recurrent high risk malignancy    F/u 3 mo for cysto / cytology  Vanna Scotland, MD

## 2023-03-18 DIAGNOSIS — Z125 Encounter for screening for malignant neoplasm of prostate: Secondary | ICD-10-CM | POA: Diagnosis not present

## 2023-03-18 DIAGNOSIS — R7301 Impaired fasting glucose: Secondary | ICD-10-CM | POA: Diagnosis not present

## 2023-03-18 DIAGNOSIS — E0590 Thyrotoxicosis, unspecified without thyrotoxic crisis or storm: Secondary | ICD-10-CM | POA: Diagnosis not present

## 2023-03-18 DIAGNOSIS — Z0000 Encounter for general adult medical examination without abnormal findings: Secondary | ICD-10-CM | POA: Diagnosis not present

## 2023-03-18 DIAGNOSIS — Z13220 Encounter for screening for lipoid disorders: Secondary | ICD-10-CM | POA: Diagnosis not present

## 2023-03-22 DIAGNOSIS — I10 Essential (primary) hypertension: Secondary | ICD-10-CM | POA: Diagnosis not present

## 2023-03-22 DIAGNOSIS — G729 Myopathy, unspecified: Secondary | ICD-10-CM | POA: Diagnosis not present

## 2023-03-22 DIAGNOSIS — E0590 Thyrotoxicosis, unspecified without thyrotoxic crisis or storm: Secondary | ICD-10-CM | POA: Diagnosis not present

## 2023-03-22 DIAGNOSIS — E782 Mixed hyperlipidemia: Secondary | ICD-10-CM | POA: Diagnosis not present

## 2023-03-22 DIAGNOSIS — Z0000 Encounter for general adult medical examination without abnormal findings: Secondary | ICD-10-CM | POA: Diagnosis not present

## 2023-06-01 ENCOUNTER — Ambulatory Visit: Payer: BC Managed Care – PPO | Admitting: Urology

## 2023-06-01 DIAGNOSIS — R310 Gross hematuria: Secondary | ICD-10-CM

## 2023-06-01 DIAGNOSIS — R82998 Other abnormal findings in urine: Secondary | ICD-10-CM | POA: Diagnosis not present

## 2023-06-01 DIAGNOSIS — C672 Malignant neoplasm of lateral wall of bladder: Secondary | ICD-10-CM

## 2023-06-01 DIAGNOSIS — R896 Abnormal cytological findings in specimens from other organs, systems and tissues: Secondary | ICD-10-CM | POA: Diagnosis not present

## 2023-06-01 LAB — URINALYSIS, COMPLETE
Bilirubin, UA: NEGATIVE
Glucose, UA: NEGATIVE
Ketones, UA: NEGATIVE
Leukocytes,UA: NEGATIVE
Nitrite, UA: NEGATIVE
Protein,UA: NEGATIVE
RBC, UA: NEGATIVE
Specific Gravity, UA: 1.02 (ref 1.005–1.030)
Urobilinogen, Ur: 0.2 mg/dL (ref 0.2–1.0)
pH, UA: 6 (ref 5.0–7.5)

## 2023-06-01 LAB — MICROSCOPIC EXAMINATION
Bacteria, UA: NONE SEEN
Epithelial Cells (non renal): NONE SEEN /HPF (ref 0–10)
RBC, Urine: NONE SEEN /HPF (ref 0–2)

## 2023-06-01 NOTE — Addendum Note (Signed)
 Addended byRanda Lynn on: 06/01/2023 10:12 AM   Modules accepted: Orders

## 2023-06-01 NOTE — Progress Notes (Signed)
   06/01/23  CC:  Chief Complaint  Patient presents with   Cysto     HPI: Jonathan Small is a 63 y.o. male with a personal history of bladder cancer, who presents today for a surveillance cystoscopy.   He is s/p TURBT on 11/2019 after which revealed incidental bladder tumor. 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 Upper tract imaging showed unremarkable.   He had a repeat cysto on 12/03/2020 that showed fine papillary changes at the 5 o'clock and 3 o'clock concerning for a satellite tumors, < 5 mm each.   He returned to the OR for repeat TURBT on 12/30/2020. Intraoperative findings: Carpeting-like papillary tumor just above the right UO at the 7 o'clock position of his previous TURBT stellate scar.  This measured approximately 1 cm and did not involve the right UO but in close proximity.  There is also some erythema at the 1 o'clock position of a stellate scar concerning for possible underlying CIS which was biopsied.  No other bladder bladder pathology identified.  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  He elected to undergo induction BCG x6. He received his last BCG on 03/25/2021.  Cysto 10/23 concerning for recurrence.  In the OR on 12/22/21 on papillary carpeting at the left dome of the bladder most consistent with early recurrence versus CIS.  There is also an area of erythema just beyond the left UO on the lateral wall concerning for underlying pathology and as such this was also biopsied.  Bilateral retrograde pyelogram normal.   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  He underwent reinduction BCG x 6 on 02/2022.  More recently he completed BCG x 3 11/2022.    NED. A&Ox3.   No respiratory distress   Abd soft, NT, ND Normal phallus with bilateral descended testicles  Cystoscopy Procedure Note  Patient identification was  confirmed, informed consent was obtained, and patient was prepped using Betadine solution.  Lidocaine jelly was administered per urethral meatus.     Pre-Procedure: - Inspection reveals a normal caliber ureteral meatus.  Procedure: The flexible cystoscope was introduced without difficulty - No urethral strictures/lesions are present. - Normal prostate  - Normal bladder neck - Bilateral ureteral orifices identified - Bladder mucosa  reveals no ulcers, tumors, or lesions.  Stellate scars appreciated.   - No bladder stones - No trabeculation Retroflexion shows unremarkable    Post-Procedure: - Patient tolerated the procedure well   Assessment/ Plan:  History of bladder cancer -NED of disease today -Will follow him closely, repeat cystoscopy in 3 months including a cytology -Cytology today -Plan for CT urogram given high risk patient -Consider another maintenance dose of BCG x 3 after next cyto    F/u 3 mo for cysto / cytology  Vanna Scotland, MD

## 2023-06-15 ENCOUNTER — Ambulatory Visit (HOSPITAL_COMMUNITY)
Admission: RE | Admit: 2023-06-15 | Discharge: 2023-06-15 | Disposition: A | Discharge status: Home / Self Care | Source: Ambulatory Visit | Attending: Urology | Admitting: Urology

## 2023-06-15 DIAGNOSIS — K4020 Bilateral inguinal hernia, without obstruction or gangrene, not specified as recurrent: Secondary | ICD-10-CM | POA: Diagnosis not present

## 2023-06-15 DIAGNOSIS — C672 Malignant neoplasm of lateral wall of bladder: Secondary | ICD-10-CM | POA: Diagnosis not present

## 2023-06-15 DIAGNOSIS — K5730 Diverticulosis of large intestine without perforation or abscess without bleeding: Secondary | ICD-10-CM | POA: Diagnosis not present

## 2023-06-15 DIAGNOSIS — C679 Malignant neoplasm of bladder, unspecified: Secondary | ICD-10-CM | POA: Diagnosis not present

## 2023-06-15 DIAGNOSIS — R310 Gross hematuria: Secondary | ICD-10-CM | POA: Diagnosis not present

## 2023-06-15 MED ORDER — IOHEXOL 300 MG/ML  SOLN
100.0000 mL | Freq: Once | INTRAMUSCULAR | Status: AC | PRN
  Administered 2023-06-15: 100 mL via INTRAVENOUS

## 2023-06-21 ENCOUNTER — Encounter: Payer: Self-pay | Admitting: Urology

## 2023-08-31 ENCOUNTER — Ambulatory Visit: Admitting: Urology

## 2023-08-31 VITALS — BP 158/96 | HR 76 | Ht 68.0 in | Wt 185.5 lb

## 2023-08-31 DIAGNOSIS — R310 Gross hematuria: Secondary | ICD-10-CM

## 2023-08-31 DIAGNOSIS — C672 Malignant neoplasm of lateral wall of bladder: Secondary | ICD-10-CM | POA: Diagnosis not present

## 2023-08-31 LAB — MICROSCOPIC EXAMINATION: Bacteria, UA: NONE SEEN

## 2023-08-31 LAB — URINALYSIS, COMPLETE
Bilirubin, UA: NEGATIVE
Glucose, UA: NEGATIVE
Ketones, UA: NEGATIVE
Leukocytes,UA: NEGATIVE
Nitrite, UA: NEGATIVE
Protein,UA: NEGATIVE
RBC, UA: NEGATIVE
Specific Gravity, UA: 1.025 (ref 1.005–1.030)
Urobilinogen, Ur: 0.2 mg/dL (ref 0.2–1.0)
pH, UA: 6 (ref 5.0–7.5)

## 2023-08-31 NOTE — Progress Notes (Signed)
   08/31/23  CC:  Chief Complaint  Patient presents with   Cysto     HPI: Jonathan Small is a 64 y.o. male with a personal history of bladder cancer, who presents today for a surveillance cystoscopy.   He is s/p TURBT on 11/2019 after which revealed incidental bladder tumor. 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 Upper tract imaging showed unremarkable.   He had a repeat cysto on 12/03/2020 that showed fine papillary changes at the 5 o'clock and 3 o'clock concerning for a satellite tumors, < 5 mm each.   He returned to the OR for repeat TURBT on 12/30/2020. Intraoperative findings: Carpeting-like papillary tumor just above the right UO at the 7 o'clock position of his previous TURBT stellate scar.  This measured approximately 1 cm and did not involve the right UO but in close proximity.  There is also some erythema at the 1 o'clock position of a stellate scar concerning for possible underlying CIS which was biopsied.  No other bladder bladder pathology identified.  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  He elected to undergo induction BCG x6. He received his last BCG on 03/25/2021.  Cysto 10/23 concerning for recurrence.  In the OR on 12/22/21 on papillary carpeting at the left dome of the bladder most consistent with early recurrence versus CIS.  There is also an area of erythema just beyond the left UO on the lateral wall concerning for underlying pathology and as such this was also biopsied.  Bilateral retrograde pyelogram normal.   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  He underwent reinduction BCG x 6 on 02/2022.  More recently he completed BCG x 3 11/2022.    CT urogram on 06/21/2023 unremarkable, no evidence of recurrent or metastatic disease.  Denies any urinary symptoms today.    NED. A&Ox3.   No respiratory distress   Abd  soft, NT, ND Normal phallus with bilateral descended testicles  Cystoscopy Procedure Note  Patient identification was confirmed, informed consent was obtained, and patient was prepped using Betadine solution.  Lidocaine  jelly was administered per urethral meatus.     Pre-Procedure: - Inspection reveals a normal caliber ureteral meatus.  Procedure: The flexible cystoscope was introduced without difficulty - No urethral strictures/lesions are present. - Normal prostate  - Normal bladder neck - Bilateral ureteral orifices identified - Bladder mucosa  reveals no ulcers, tumors, or lesions.  Stellate scars appreciated.   - No bladder stones - No trabeculation Retroflexion shows unremarkable    Post-Procedure: - Patient tolerated the procedure well   Assessment/ Plan:  History of bladder cancer -NED of disease today -Cytology today - He does want to proceed with another round of maintenance BCG which we can facilitate - He is interested in transferring his care to [facility] since he no longer works here in [place] and does reside in [place].  Will place a referral, request Dr. Manson Seitz.  He will be due for cystoscopy in about 3 to 4 months.   Dustin Gimenez, MD

## 2023-09-26 NOTE — Progress Notes (Unsigned)
 BCG Bladder Instillation  BCG # 1/3  Due to Bladder Cancer patient is present today for a BCG treatment. Patient was cleaned and prepped in a sterile fashion with betadine. A 14 FR catheter was inserted, urine return was noted 20 ml, urine was yellow clear in color.  50ml of reconstituted BCG was instilled into the bladder. The catheter was then removed. Patient tolerated well, no complications were noted  Performed by: CLOTILDA CORNWALL, PA-C and Mathew Pinal, RN and Gordan Beams, CMA   Follow up/ Additional notes: One week for # 2/3 BCG

## 2023-09-28 ENCOUNTER — Ambulatory Visit: Admitting: Physician Assistant

## 2023-09-28 ENCOUNTER — Ambulatory Visit (INDEPENDENT_AMBULATORY_CARE_PROVIDER_SITE_OTHER): Admitting: Urology

## 2023-09-28 VITALS — BP 130/79 | HR 72 | Ht 68.0 in | Wt 185.0 lb

## 2023-09-28 DIAGNOSIS — C672 Malignant neoplasm of lateral wall of bladder: Secondary | ICD-10-CM

## 2023-09-28 LAB — URINALYSIS, COMPLETE
Bilirubin, UA: NEGATIVE
Glucose, UA: NEGATIVE
Ketones, UA: NEGATIVE
Leukocytes,UA: NEGATIVE
Nitrite, UA: NEGATIVE
Protein,UA: NEGATIVE
RBC, UA: NEGATIVE
Specific Gravity, UA: 1.015 (ref 1.005–1.030)
Urobilinogen, Ur: 0.2 mg/dL (ref 0.2–1.0)
pH, UA: 6 (ref 5.0–7.5)

## 2023-09-28 LAB — MICROSCOPIC EXAMINATION: Bacteria, UA: NONE SEEN

## 2023-09-28 MED ORDER — BCG LIVE 50 MG IS SUSR
3.2400 mL | Freq: Once | INTRAVESICAL | Status: AC
  Administered 2023-09-28: 81 mg via INTRAVESICAL

## 2023-10-05 ENCOUNTER — Ambulatory Visit: Admitting: Physician Assistant

## 2023-10-05 DIAGNOSIS — D494 Neoplasm of unspecified behavior of bladder: Secondary | ICD-10-CM

## 2023-10-05 DIAGNOSIS — C679 Malignant neoplasm of bladder, unspecified: Secondary | ICD-10-CM | POA: Diagnosis not present

## 2023-10-05 LAB — URINALYSIS, COMPLETE
Bilirubin, UA: NEGATIVE
Glucose, UA: NEGATIVE
Ketones, UA: NEGATIVE
Leukocytes,UA: NEGATIVE
Nitrite, UA: NEGATIVE
Protein,UA: NEGATIVE
RBC, UA: NEGATIVE
Specific Gravity, UA: 1.015 (ref 1.005–1.030)
Urobilinogen, Ur: 0.2 mg/dL (ref 0.2–1.0)
pH, UA: 6 (ref 5.0–7.5)

## 2023-10-05 LAB — MICROSCOPIC EXAMINATION: Bacteria, UA: NONE SEEN

## 2023-10-05 MED ORDER — BCG LIVE 50 MG IS SUSR
3.2400 mL | Freq: Once | INTRAVESICAL | Status: AC
Start: 2023-10-05 — End: 2023-10-05
  Administered 2023-10-05: 81 mg via INTRAVESICAL

## 2023-10-05 NOTE — Progress Notes (Signed)
 BCG Bladder Instillation  BCG # 2  Due to Bladder Cancer patient is present today for a BCG treatment. Patient was cleaned and prepped in a sterile fashion with betadine. A 14FR catheter was inserted, urine return was noted 0ml, urine was clear in color.  50ml of reconstituted BCG was instilled into the bladder. The catheter was then removed. Patient tolerated well, no complications were noted  Performed by: Mathew Pinal, RN, Laymon Ned, CMA  Follow up/ Additional notes: one week 3/3 BCG

## 2023-10-12 ENCOUNTER — Ambulatory Visit (INDEPENDENT_AMBULATORY_CARE_PROVIDER_SITE_OTHER): Admitting: Physician Assistant

## 2023-10-12 VITALS — BP 129/85 | HR 68 | Ht 68.0 in | Wt 190.0 lb

## 2023-10-12 DIAGNOSIS — D494 Neoplasm of unspecified behavior of bladder: Secondary | ICD-10-CM

## 2023-10-12 DIAGNOSIS — C679 Malignant neoplasm of bladder, unspecified: Secondary | ICD-10-CM | POA: Diagnosis not present

## 2023-10-12 LAB — MICROSCOPIC EXAMINATION: Bacteria, UA: NONE SEEN

## 2023-10-12 LAB — URINALYSIS, COMPLETE
Bilirubin, UA: NEGATIVE
Glucose, UA: NEGATIVE
Ketones, UA: NEGATIVE
Leukocytes,UA: NEGATIVE
Nitrite, UA: NEGATIVE
Protein,UA: NEGATIVE
RBC, UA: NEGATIVE
Specific Gravity, UA: 1.015 (ref 1.005–1.030)
Urobilinogen, Ur: 0.2 mg/dL (ref 0.2–1.0)
pH, UA: 6 (ref 5.0–7.5)

## 2023-10-12 MED ORDER — BCG LIVE 50 MG IS SUSR
3.2400 mL | Freq: Once | INTRAVESICAL | Status: AC
  Administered 2023-10-12: 81 mg via INTRAVESICAL

## 2023-10-12 NOTE — Progress Notes (Signed)
 BCG Bladder Instillation  BCG # 3  Due to Bladder Cancer patient is present today for a BCG treatment. Patient was cleaned and prepped in a sterile fashion with betadine. A 14FR catheter was inserted, urine return was noted 10ml, urine was yellow in color.  50ml of reconstituted BCG was instilled into the bladder. The catheter was then removed. Patient tolerated well, no complications were noted  Performed by: Rahul Malinak, PA-C and Neva Hyler, CMA  Additional notes: He has an appointment to transfer care to Dr. Alvaro, states cysto is scheduled for October.  Follow up: Return if symptoms worsen or fail to improve.

## 2023-12-01 ENCOUNTER — Ambulatory Visit: Admitting: Physician Assistant

## 2023-12-01 ENCOUNTER — Encounter: Payer: Self-pay | Admitting: Physician Assistant

## 2023-12-01 VITALS — BP 152/81 | HR 74

## 2023-12-01 DIAGNOSIS — D235 Other benign neoplasm of skin of trunk: Secondary | ICD-10-CM | POA: Diagnosis not present

## 2023-12-01 DIAGNOSIS — L821 Other seborrheic keratosis: Secondary | ICD-10-CM

## 2023-12-01 DIAGNOSIS — D225 Melanocytic nevi of trunk: Secondary | ICD-10-CM | POA: Diagnosis not present

## 2023-12-01 DIAGNOSIS — B079 Viral wart, unspecified: Secondary | ICD-10-CM

## 2023-12-01 DIAGNOSIS — D229 Melanocytic nevi, unspecified: Secondary | ICD-10-CM

## 2023-12-01 DIAGNOSIS — L578 Other skin changes due to chronic exposure to nonionizing radiation: Secondary | ICD-10-CM

## 2023-12-01 DIAGNOSIS — D489 Neoplasm of uncertain behavior, unspecified: Secondary | ICD-10-CM | POA: Diagnosis not present

## 2023-12-01 DIAGNOSIS — B078 Other viral warts: Secondary | ICD-10-CM | POA: Diagnosis not present

## 2023-12-01 DIAGNOSIS — L814 Other melanin hyperpigmentation: Secondary | ICD-10-CM | POA: Diagnosis not present

## 2023-12-01 DIAGNOSIS — L570 Actinic keratosis: Secondary | ICD-10-CM

## 2023-12-01 DIAGNOSIS — W908XXA Exposure to other nonionizing radiation, initial encounter: Secondary | ICD-10-CM

## 2023-12-01 DIAGNOSIS — Z1283 Encounter for screening for malignant neoplasm of skin: Secondary | ICD-10-CM

## 2023-12-01 DIAGNOSIS — D1801 Hemangioma of skin and subcutaneous tissue: Secondary | ICD-10-CM

## 2023-12-01 MED ORDER — TOLAK 4 % EX CREA: TOPICAL_CREAM | CUTANEOUS | 1 refills | Status: AC

## 2023-12-01 NOTE — Progress Notes (Signed)
 New Patient Visit   Subjective  Jonathan Small is a 63 y.o. male who presents for the following:  Total Body Skin Exam (TBSE).   Patient present today for new patient visit for TBSE.The patient reports he  has spots, moles and lesions to be evaluated, some may be new or changing and the patient may have concern these could be cancer. Patient reports he would like for us  to look at his left leg, left ear and finger nails. Patient has previously been treated by a dermatologist.Patient reports he  has hx of bx. Patient denies family history of skin cancers. Patient reports throughout his lifetime has had moderate sun exposure. Currently, patient reports if he  has excessive sun exposure, he  does apply sunscreen and/or wears protective coverings.  Prior patient at [facility] in [place]. Has history of AK and underwent surgery on his chest but he is not sure for what reason.   Will request prior pathology records.   The following portions of the chart were reviewed this encounter and updated as appropriate: medications, allergies, medical history  Review of Systems:  No other skin or systemic complaints except as noted in HPI or Assessment and Plan.  Objective  Well appearing patient in no apparent distress; mood and affect are within normal limits.  A full examination was performed including scalp, head, eyes, ears, nose, lips, neck, chest, axillae, abdomen, back, buttocks, bilateral upper extremities, bilateral lower extremities, hands, feet, fingers, toes, fingernails, and toenails. All findings within normal limits unless otherwise noted below.     Relevant exam findings are noted in the Assessment and Plan.  Left lateral leg 1 cm Hyperkeratotic nodule   Left Breast 0.3 cm Irregular brown macule   Assessment & Plan   LENTIGINES, SEBORRHEIC KERATOSES, HEMANGIOMAS - Benign normal skin lesions - Benign-appearing - Call for any changes  MELANOCYTIC NEVI - Tan-brown  and/or pink-flesh-colored symmetric macules and papules - Benign appearing on exam today - Observation - Call clinic for new or changing moles - Recommend daily use of broad spectrum spf 30+ sunscreen to sun-exposed areas.   ACTINIC DAMAGE - Chronic condition, secondary to cumulative UV/sun exposure - diffuse scaly erythematous macules with underlying dyspigmentation - Recommend daily broad spectrum sunscreen SPF 30+ to sun-exposed areas, reapply every 2 hours as needed.  - Staying in the shade or wearing long sleeves, sun glasses (UVA+UVB protection) and wide brim hats (4-inch brim around the entire circumference of the hat) are also recommended for sun protection.  - Call for new or changing lesions. - PLAN TOLAK  (4 Fluorouracil ) to scalp this winter.   SKIN CANCER SCREENING PERFORMED TODAY NEOPLASM OF UNCERTAIN BEHAVIOR (2) Left lateral leg Skin / nail biopsy Type of biopsy: tangential   Informed consent: discussed and consent obtained   Timeout: patient name, date of birth, surgical site, and procedure verified   Procedure prep:  Patient was prepped and draped in usual sterile fashion Prep type:  Isopropyl alcohol Anesthesia: the lesion was anesthetized in a standard fashion   Anesthetic:  1% lidocaine  w/ epinephrine 1-100,000 buffered w/ 8.4% NaHCO3 Instrument used: flexible razor blade   Hemostasis achieved with: pressure, aluminum chloride and electrodesiccation   Outcome: patient tolerated procedure well   Post-procedure details: sterile dressing applied and wound care instructions given   Dressing type: bandage and petrolatum    Specimen A - Surgical pathology Differential Diagnosis: Wart VS SCC  Check Margins: No Left Breast Epidermal / dermal shaving  Lesion diameter (  cm):  0.3 Informed consent: discussed and consent obtained   Timeout: patient name, date of birth, surgical site, and procedure verified   Procedure prep:  Patient was prepped and draped in usual  sterile fashion Prep type:  Isopropyl alcohol Anesthesia: the lesion was anesthetized in a standard fashion   Anesthetic:  1% lidocaine  w/ epinephrine 1-100,000 buffered w/ 8.4% NaHCO3 Instrument used: flexible razor blade   Hemostasis achieved with: pressure, aluminum chloride and electrodesiccation   Outcome: patient tolerated procedure well   Post-procedure details: sterile dressing applied and wound care instructions given   Dressing type: bandage and petrolatum    Specimen B - Surgical pathology Differential Diagnosis: DN VS MM  Check Margins: No SCREENING EXAM FOR SKIN CANCER   LENTIGINES   SEBORRHEIC KERATOSIS   MULTIPLE BENIGN NEVI   ACTINIC SKIN DAMAGE   CHERRY ANGIOMA   AK (ACTINIC KERATOSIS) (11) Left Supratip of Nose, Mid Frontal Scalp (10) Destruction of lesion - Left Supratip of Nose, Mid Frontal Scalp (10) Complexity: simple   Destruction method: cryotherapy   Informed consent: discussed and consent obtained   Timeout:  patient name, date of birth, surgical site, and procedure verified Lesion destroyed using liquid nitrogen: Yes   Region frozen until ice ball extended beyond lesion: Yes   Outcome: patient tolerated procedure well with no complications   Post-procedure details: wound care instructions given     Return in about 6 months (around 05/30/2024) for TBSE follow up please sign release for previous pathology . I, Doyce Pan, CMA, am acting as scribe for Manases Etchison K, PA-C.   Documentation: I have reviewed the above documentation for accuracy and completeness, and I agree with the above.  Cniyah Sproull K, PA-C

## 2023-12-01 NOTE — Patient Instructions (Addendum)

## 2023-12-03 LAB — SURGICAL PATHOLOGY

## 2023-12-04 ENCOUNTER — Ambulatory Visit: Payer: Self-pay | Admitting: Physician Assistant

## 2024-01-10 DIAGNOSIS — C674 Malignant neoplasm of posterior wall of bladder: Secondary | ICD-10-CM | POA: Diagnosis not present

## 2024-03-23 ENCOUNTER — Other Ambulatory Visit (HOSPITAL_BASED_OUTPATIENT_CLINIC_OR_DEPARTMENT_OTHER): Payer: Self-pay | Admitting: Family Medicine

## 2024-03-23 DIAGNOSIS — E782 Mixed hyperlipidemia: Secondary | ICD-10-CM

## 2024-04-05 ENCOUNTER — Ambulatory Visit (HOSPITAL_BASED_OUTPATIENT_CLINIC_OR_DEPARTMENT_OTHER)
Admission: RE | Admit: 2024-04-05 | Discharge: 2024-04-05 | Disposition: A | Discharge status: Home / Self Care | Payer: Self-pay | Source: Ambulatory Visit | Attending: Family Medicine | Admitting: Family Medicine

## 2024-04-05 DIAGNOSIS — E782 Mixed hyperlipidemia: Secondary | ICD-10-CM | POA: Insufficient documentation

## 2024-04-18 ENCOUNTER — Ambulatory Visit: Admitting: Cardiology

## 2024-04-18 ENCOUNTER — Other Ambulatory Visit (HOSPITAL_COMMUNITY): Payer: Self-pay

## 2024-04-18 ENCOUNTER — Encounter: Payer: Self-pay | Admitting: Cardiology

## 2024-04-18 VITALS — BP 120/62 | HR 91 | Ht 68.0 in

## 2024-04-18 DIAGNOSIS — I10 Essential (primary) hypertension: Secondary | ICD-10-CM | POA: Diagnosis not present

## 2024-04-18 DIAGNOSIS — I2510 Atherosclerotic heart disease of native coronary artery without angina pectoris: Secondary | ICD-10-CM

## 2024-04-18 DIAGNOSIS — I25118 Atherosclerotic heart disease of native coronary artery with other forms of angina pectoris: Secondary | ICD-10-CM | POA: Insufficient documentation

## 2024-04-18 DIAGNOSIS — E782 Mixed hyperlipidemia: Secondary | ICD-10-CM

## 2024-04-18 MED ORDER — ROSUVASTATIN CALCIUM 20 MG PO TABS
20.0000 mg | ORAL_TABLET | Freq: Every day | ORAL | 3 refills | Status: AC
  Filled 2024-04-18: qty 90, 90d supply, fill #0

## 2024-04-18 NOTE — Patient Instructions (Signed)
 Medication Instructions:  Medications Discontinued During This Encounter  Medication Reason   simvastatin (ZOCOR) 40 MG tablet Discontinued by provider   Meds ordered this encounter  Medications   rosuvastatin  (CRESTOR ) 20 MG tablet    Sig: Take 1 tablet (20 mg total) by mouth daily.    Dispense:  90 tablet    Refill:  3     *If you need a refill on your cardiac medications before your next appointment, please call your pharmacy*  Lab Work IN 06/2024 Lab Orders         Lipid panel         Lipoprotein A (LPA)     If you have labs (blood work) drawn today and your tests are completely normal, you will receive your results only by: MyChart Message (if you have MyChart) OR A paper copy in the mail If you have any lab test that is abnormal or we need to change your treatment, we will call you to review the results.  Testing/Procedures: PET/CT STRESS TEST   How to Prepare for Your Cardiac PET/CT Stress Test:  1. Please do not take these medications before your test:  ~Medications that may interfere with the cardiac pharmacological stress agent (ex. nitrates - including erectile dysfunction medications, isosorbide mononitrate, tamulosin or beta-blockers) the day of the exam. (Erectile dysfunction medication should be held for at least 72 hrs prior to test) ~Theophylline containing medications for 12 hours. ~Dipyridamole 48 hours prior to the test. ~Your remaining medications may be taken with water .  2. Nothing to eat or drink, except water , 3 hours prior to arrival time.   ~ NO caffeine/decaffeinated products, or chocolate 12 hours prior to arrival.  3. NO perfume, cologne or lotion on chest or abdomen area.         - FEMALES - Please avoid wearing dresses to this appointment.  4. Total time is 1 to 2 hours; you may want to bring reading material for the waiting time.  Please report to Radiology at the [facility] Main Entrance 30 minutes early for your test. [address] [place], [place] [address]  OR  Please report to Radiology at [facility] Main Entrance, medical mall, 30 mins prior to your test. [address] [place], [place] [phone]  Diabetic Preparation: - Hold oral medications. - You may take NPH and Lantus insulin. - Do not take Humalog or Humulin R (Regular Insulin) the day of your test. - Check blood sugars prior to leaving the house. - If able to eat breakfast prior to 3 hour fasting, you may take all medications, including your insulin, - Do not worry if you miss your breakfast dose of insulin - start at your next meal. - Patients who wear a continuous glucose monitor MUST remove the device prior to scanning.  IF YOU THINK YOU MAY BE PREGNANT, OR ARE NURSING PLEASE INFORM THE TECHNOLOGIST.  In preparation for your appointment, medication and supplies will be purchased.  Appointment availability is limited, so if you need to cancel or reschedule, please call the Radiology Department at [phone] Geroge Law) OR [phone] [facility])  24 hours in advance to avoid a cancellation fee of $100.00  What to Expect After you Arrive:  Once you arrive and check in for your appointment, you will be taken to a preparation room within the Radiology Department.  A technologist or Nurse will obtain your medical history, verify that you are correctly prepped for the exam, and  explain the procedure.  Afterwards,  an IV will be started in your arm and electrodes will be placed on your skin for EKG monitoring during the stress portion of the exam. Then you will be escorted to the PET/CT scanner.  There, staff will get you positioned on the scanner and obtain a blood pressure and EKG.  During the exam, you will continue to be connected to the EKG and blood pressure machines.  A small, safe amount of a radioactive tracer will be injected in your IV to obtain a series of pictures of your heart along with an injection of a  stress agent.    After your Exam:  It is recommended that you eat a meal and drink a caffeinated beverage to counter act any effects of the stress agent.  Drink plenty of fluids for the remainder of the day and urinate frequently for the first couple of hours after the exam.  Your doctor will inform you of your test results within 7-10 business days.  For more information and frequently asked questions, please visit our website : [email or web address]  For questions about your test or how to prepare for your test, please call: Cardiac Imaging Nurse Navigators Office: [phone]    Follow-Up: At [facility], you and your health needs are our priority.  As part of our continuing mission to provide you with exceptional heart care, our providers are all part of one team.  This team includes your primary Cardiologist (physician) and Advanced Practice Providers or APPs (Physician Assistants and Nurse Practitioners) who all work together to provide you with the care you need, when you need it.  Your next appointment:   07/2024   Provider:   Newman JINNY Lawrence, MD    We recommend signing up for the patient portal called MyChart.  Sign up information is provided on this After Visit Summary.  MyChart is used to connect with patients for Virtual Visits (Telemedicine).  Patients are able to view lab/test results, encounter notes, upcoming appointments, etc.  Non-urgent messages can be sent to your provider as well.   To learn more about what you can do with MyChart, go to [email or web address].   Other Instructions

## 2024-05-23 ENCOUNTER — Other Ambulatory Visit (HOSPITAL_COMMUNITY)

## 2024-05-31 ENCOUNTER — Ambulatory Visit: Admitting: Physician Assistant

## 2024-07-17 ENCOUNTER — Ambulatory Visit: Admitting: Cardiology
